# Patient Record
Sex: Female | Born: 1980
Health system: Southern US, Community
[De-identification: ages and names within clinical notes are randomized; demographics above are authoritative.]

## PROBLEM LIST (undated history)

## (undated) DIAGNOSIS — A599 Trichomoniasis, unspecified: Secondary | ICD-10-CM

## (undated) DIAGNOSIS — E010 Iodine-deficiency related diffuse (endemic) goiter: Secondary | ICD-10-CM

## (undated) HISTORY — DX: Iodine-deficiency related diffuse (endemic) goiter: E01.0

## (undated) HISTORY — DX: Trichomoniasis, unspecified: A59.9

---

## 2004-03-27 ENCOUNTER — Emergency Department (HOSPITAL_COMMUNITY): Admission: EM | Admit: 2004-03-27 | Discharge: 2004-03-28 | Payer: Self-pay | Admitting: Emergency Medicine

## 2011-10-19 ENCOUNTER — Ambulatory Visit (INDEPENDENT_AMBULATORY_CARE_PROVIDER_SITE_OTHER): Payer: PRIVATE HEALTH INSURANCE | Admitting: Family Medicine

## 2011-10-19 ENCOUNTER — Encounter: Payer: Self-pay | Admitting: Family Medicine

## 2011-10-19 VITALS — BP 136/79 | HR 57 | Temp 98.9°F | Resp 16 | Ht 62.5 in | Wt 143.6 lb

## 2011-10-19 DIAGNOSIS — Z0000 Encounter for general adult medical examination without abnormal findings: Secondary | ICD-10-CM

## 2011-10-19 DIAGNOSIS — E049 Nontoxic goiter, unspecified: Secondary | ICD-10-CM

## 2011-10-19 DIAGNOSIS — Z130 Encounter for screening for diseases of the blood and blood-forming organs and certain disorders involving the immune mechanism: Secondary | ICD-10-CM

## 2011-10-19 DIAGNOSIS — Z13220 Encounter for screening for lipoid disorders: Secondary | ICD-10-CM

## 2011-10-19 DIAGNOSIS — Z720 Tobacco use: Secondary | ICD-10-CM | POA: Insufficient documentation

## 2011-10-19 DIAGNOSIS — Z23 Encounter for immunization: Secondary | ICD-10-CM

## 2011-10-19 DIAGNOSIS — Z3042 Encounter for surveillance of injectable contraceptive: Secondary | ICD-10-CM | POA: Insufficient documentation

## 2011-10-19 DIAGNOSIS — Z01419 Encounter for gynecological examination (general) (routine) without abnormal findings: Secondary | ICD-10-CM

## 2011-10-19 DIAGNOSIS — Z309 Encounter for contraceptive management, unspecified: Secondary | ICD-10-CM | POA: Insufficient documentation

## 2011-10-19 DIAGNOSIS — Z124 Encounter for screening for malignant neoplasm of cervix: Secondary | ICD-10-CM

## 2011-10-19 LAB — COMPREHENSIVE METABOLIC PANEL
ALT: 12 U/L (ref 0–35)
AST: 20 U/L (ref 0–37)
Alkaline Phosphatase: 58 U/L (ref 39–117)
CO2: 25 mEq/L (ref 19–32)
Chloride: 106 mEq/L (ref 96–112)

## 2011-10-19 LAB — CBC WITH DIFFERENTIAL/PLATELET
Eosinophils Relative: 1 % (ref 0–5)
HCT: 41.3 % (ref 36.0–46.0)
Lymphocytes Relative: 43 % (ref 12–46)
Lymphs Abs: 3.4 10*3/uL (ref 0.7–4.0)
MCV: 94.5 fL (ref 78.0–100.0)
Monocytes Absolute: 0.5 10*3/uL (ref 0.1–1.0)
Monocytes Relative: 6 % (ref 3–12)
RBC: 4.37 MIL/uL (ref 3.87–5.11)
WBC: 8 10*3/uL (ref 4.0–10.5)

## 2011-10-19 LAB — LIPID PANEL
Cholesterol: 192 mg/dL (ref 0–200)
HDL: 46 mg/dL (ref >39)
Total CHOL/HDL Ratio: 4.2 Ratio

## 2011-10-19 LAB — POCT WET PREP WITH KOH
KOH Prep POC: NEGATIVE
Trichomonas, UA: NEGATIVE

## 2011-10-19 LAB — TSH: TSH: 0.435 u[IU]/mL (ref 0.350–4.500)

## 2011-10-19 MED ORDER — MEDROXYPROGESTERONE ACETATE 150 MG/ML IM SUSP: 150.0000 mg | INTRAMUSCULAR | Status: DC

## 2011-10-19 NOTE — Progress Notes (Signed)
Subjective:    Patient ID: Lacey Wu, female    DOB: 03/08/1981, 31 y.o.   MRN: [id number]  HPI   This 31 y.o. AA female is new to this facility and requests CPE with PAP. Last PAP was ~ 1 year ago  and was normal. She is single, nulliparous and smokes 1/2 ppd for 11 years. She does not consume  alcohol nor exercise regularly. She works full-time at [employer].           DepoProvera is her preferred method of contraception; she last received an injection 2 years ago.    Review of Systems  Constitutional: Negative.   Eyes: Positive for visual disturbance.       Has corrective lenses; no recent exam  Respiratory: Negative.   Cardiovascular: Negative.   Gastrointestinal: Negative.   Genitourinary: Negative.   Musculoskeletal: Negative.   Neurological: Negative.   Hematological: Negative.   Psychiatric/Behavioral: Negative.        Objective:   Physical Exam  Nursing note and vitals reviewed. Constitutional: She is oriented to person, place, and time. She appears well-developed and well-nourished. No distress.  HENT:  Head: Normocephalic and atraumatic.  Right Ear: External ear normal.  Left Ear: External ear normal.  Nose: Nose normal.  Mouth/Throat: Oropharynx is clear and moist.  Eyes: Conjunctivae and EOM are normal. Pupils are equal, round, and reactive to light. No scleral icterus.  Neck: Normal range of motion. Neck supple. Thyromegaly present.  Cardiovascular: Normal rate, regular rhythm and normal heart sounds.  Exam reveals no gallop and no friction rub.   No murmur heard. Pulmonary/Chest: Effort normal and breath sounds normal. No respiratory distress. She has no wheezes. Right breast exhibits no inverted nipple, no mass, no nipple discharge, no skin change and no tenderness. Left breast exhibits no inverted nipple, no mass, no nipple discharge, no skin change and no tenderness. Breasts are symmetrical.  Abdominal: Soft. Bowel sounds are normal. She  exhibits no mass. There is no tenderness. There is no guarding.  Genitourinary: Uterus normal. There is no rash, tenderness or lesion on the right labia. There is no rash, tenderness or lesion on the left labia. Cervix exhibits discharge. Cervix exhibits no motion tenderness and no friability. Right adnexum displays no mass, no tenderness and no fullness. Left adnexum displays no mass, no tenderness and no fullness. No erythema, tenderness or bleeding around the vagina. Vaginal discharge found.  Musculoskeletal: Normal range of motion.  Lymphadenopathy:    She has no cervical adenopathy.       Right: No inguinal adenopathy present.       Left: No inguinal adenopathy present.  Neurological: She is alert and oriented to person, place, and time. She displays abnormal reflex. No cranial nerve deficit. She exhibits normal muscle tone. Coordination normal.       DTRs absent in upper ext; 0-1+ in lower ext  Skin: Skin is warm and dry. No rash noted. No erythema.  Psychiatric: She has a normal mood and affect. Her behavior is normal. Judgment and thought content normal.    Results for orders placed in visit on 10/19/11  POCT WET PREP WITH KOH      Component Value Range   Trichomonas, UA Negative     Clue Cells Wet Prep HPF POC 4-8     Epithelial Wet Prep HPF POC 2-20     Yeast Wet Prep HPF POC neg     Bacteria Wet Prep HPF POC 1+  RBC Wet Prep HPF POC neg     WBC Wet Prep HPF POC 0-5     KOH Prep POC Negative          Assessment & Plan:   1. Routine general medical examination at a health care facility  Comprehensive metabolic panel  2. Encounter for cervical Pap smear with pelvic exam  Pap IG, CT/NG w/ reflex HPV when ASC-U  3. Enlarged thyroid gland  TSH, Free T4  4. Need for prophylactic vaccination with combined diphtheria-tetanus-pertussis (DTP) vaccine  Tdap vaccine greater than or equal to 7yo IM  5. Screening for deficiency anemia  CBC with Differential  6. Screening for  hyperlipidemia  Lipid panel  7. Contraception management  RX: DepoProvera for injection; pt to call clinic to set up 1st injection on the 5th day of next menses  8. Tobacco user  Counsel to try Nicotine gum again and pick a QUIT date

## 2011-10-19 NOTE — Patient Instructions (Addendum)
Keeping You Healthy  Get These Tests 1. Blood Pressure- Have your blood pressure checked once a year by your health care provider.  Normal blood pressure is 120/80. 2. Weight- Have your body mass index (BMI) calculated to screen for obesity.  BMI is measure of body fat based on height and weight.  You can also calculate your own BMI at [email or web address]://www.west-[email or web address]/. 3. Cholesterol- Have your cholesterol checked every 5 years starting at age 29 then yearly starting at age 29. 4. Chlamydia, HIV, and other sexually transmitted diseases- Get screened every year until age 68, then within three months of each new sexual provider. 5. Pap Smear- Every 1-3 years; discuss with your health care provider. 6. Mammogram- Every year starting at age 81  Take these medicines  Calcium with Vitamin D-Your body needs 1200 mg of Calcium each day and [phone] IU of Vitamin D daily.  Your body can only absorb 500 mg of Calcium at a time so Calcium must be taken in 2 or 3 divided doses throughout the day.  Multivitamin with folic acid- Once daily if it is possible for you to become pregnant.  Get these Immunizations  Gardasil-Series of three doses; prevents HPV related illness such as genital warts and cervical cancer.  Menactra-Single dose; prevents meningitis.  Tetanus shot- Every 10 years.  Flu shot-Every year.  Take these steps 1. Do not smoke-Your healthcare provider can help you quit.  For tips on how to quit go to www.smokefree.gov or call 1-800 QUITNOW. 2. Be physically active- Exercise 5 days a week for at least 30 minutes.  If you are not already physically active, start slow and gradually work up to 30 minutes of moderate physical activity.  Examples of moderate activity include walking briskly, dancing, swimming, bicycling, etc. 3. Breast Cancer- A self breast exam every month is important for early detection of breast cancer.  For more information and instruction on self breast exams, ask your  healthcare provider or [email or web address]. 4. Eat a healthy diet- Eat a variety of healthy foods such as fruits, vegetables, whole grains, low fat milk, low fat cheeses, yogurt, lean meats, poultry and fish, beans, nuts, tofu, etc.  For more information go to www. Thenutritionsource.org 5. Drink alcohol in moderation- Limit alcohol intake to one drink or less per day. Never drink and drive. 6. Depression- Your emotional health is as important as your physical health.  If you're feeling down or losing interest in things you normally enjoy please talk to your healthcare provider about being screened for depression. 7. Dental visit- Brush and floss your teeth twice daily; visit your dentist twice a year. 8. Eye doctor- Get an eye exam at least every 2 years. 9. Helmet use- Always wear a helmet when riding a bicycle, motorcycle, rollerblading or skateboarding. 10. Safe sex- If you may be exposed to sexually transmitted infections, use a condom. 11. Seat belts- Seat belts can save your live; always wear one. 12. Smoke/Carbon Monoxide detectors- These detectors need to be installed on the appropriate level of your home. Replace batteries at least once a year. 13. Skin cancer- When out in the sun please cover up and use sunscreen 15 SPF or higher. 14. Violence- If anyone is threatening or hurting you, please tell your healthcare provider.       Smoking Cessation This document explains the best ways for you to quit smoking and new treatments to help. It lists new medicines that can double or triple your chances of quitting and quitting for  good. It also considers ways to avoid relapses and concerns you may have about quitting, including weight gain. NICOTINE: A POWERFUL ADDICTION If you have tried to quit smoking, you know how hard it can be. It is hard because nicotine is a very addictive drug. For some people, it can be as addictive as heroin or cocaine. Usually, people make  2 or 3 tries, or more, before finally being able to quit. Each time you try to quit, you can learn about what helps and what hurts. Quitting takes hard work and a lot of effort, but you can quit smoking. QUITTING SMOKING IS ONE OF THE MOST IMPORTANT THINGS YOU WILL EVER DO.  You will live longer, feel better, and live better.   The impact on your body of quitting smoking is felt almost immediately:   Within 20 minutes, blood pressure decreases. Pulse returns to its normal level.   After 8 hours, carbon monoxide levels in the blood return to normal. Oxygen level increases.   After 24 hours, chance of heart attack starts to decrease. Breath, hair, and body stop smelling like smoke.   After 48 hours, damaged nerve endings begin to recover. Sense of taste and smell improve.   After 72 hours, the body is virtually free of nicotine. Bronchial tubes relax and breathing becomes easier.   After 2 to 12 weeks, lungs can hold more air. Exercise becomes easier and circulation improves.   Quitting will reduce your risk of having a heart attack, stroke, cancer, or lung disease:   After 1 year, the risk of coronary heart disease is cut in half.   After 5 years, the risk of stroke falls to the same as a nonsmoker.   After 10 years, the risk of lung cancer is cut in half and the risk of other cancers decreases significantly.   After 15 years, the risk of coronary heart disease drops, usually to the level of a nonsmoker.   If you are pregnant, quitting smoking will improve your chances of having a healthy baby.   The people you live with, especially your children, will be healthier.   You will have extra money to spend on things other than cigarettes.  FIVE KEYS TO QUITTING Studies have shown that these 5 steps will help you quit smoking and quit for good. You have the best chances of quitting if you use them together: 1. Get ready.  2. Get support and encouragement.  3. Learn new skills and  behaviors.  4. Get medicine to reduce your nicotine addiction and use it correctly.  5. Be prepared for relapse or difficult situations. Be determined to continue trying to quit, even if you do not succeed at first.  1. GET READY  Set a quit date.   Change your environment.   Get rid of ALL cigarettes, ashtrays, matches, and lighters in your home, car, and place of work.   Do not let people smoke in your home.   Review your past attempts to quit. Think about what worked and what did not.   Once you quit, do not smoke. NOT EVEN A PUFF!  2. GET SUPPORT AND ENCOURAGEMENT Studies have shown that you have a better chance of being successful if you have help. You can get support in many ways.  Tell your family, friends, and coworkers that you are going to quit and need their support. Ask them not to smoke around you.   Talk to your caregivers (doctor, dentist, nurse, pharmacist, psychologist,  and/or smoking counselor).   Get individual, group, or telephone counseling and support. The more counseling you have, the better your chances are of quitting. Programs are available at [employer] and health centers. Call your local health department for information about programs in your area.   Spiritual beliefs and practices may help some smokers quit.   Quit meters are Photographer that keep track of quit statistics, such as amount of "quit-time," cigarettes not smoked, and money saved.   Many smokers find one or more of the many self-help books available useful in helping them quit and stay off tobacco.  3. LEARN NEW SKILLS AND BEHAVIORS  Try to distract yourself from urges to smoke. Talk to someone, go for a walk, or occupy your time with a task.   When you first try to quit, change your routine. Take a different route to work. Drink tea instead of coffee. Eat breakfast in a different place.   Do something to reduce your stress. Take a hot bath,  exercise, or read a book.   Plan something enjoyable to do every day. Reward yourself for not smoking.   Explore interactive web-based programs that specialize in helping you quit.  4. GET MEDICINE AND USE IT CORRECTLY Medicines can help you stop smoking and decrease the urge to smoke. Combining medicine with the above behavioral methods and support can quadruple your chances of successfully quitting smoking. The U.[place]. Food and Drug Administration (FDA) has approved 7 medicines to help you quit smoking. These medicines fall into 3 categories.  Nicotine replacement therapy (delivers nicotine to your body without the negative effects and risks of smoking):   Nicotine gum: Available over-the-counter.   Nicotine lozenges: Available over-the-counter.   Nicotine inhaler: Available by prescription.   Nicotine nasal spray: Available by prescription.   Nicotine skin patches (transdermal): Available by prescription and over-the-counter.   Antidepressant medicine (helps people abstain from smoking, but how this works is unknown):   Bupropion sustained-release (SR) tablets: Available by prescription.   Nicotinic receptor partial agonist (simulates the effect of nicotine in your brain):   Varenicline tartrate tablets: Available by prescription.   Ask your caregiver for advice about which medicines to use and how to use them. Carefully read the information on the package.   Everyone who is trying to quit may benefit from using a medicine. If you are pregnant or trying to become pregnant, nursing an infant, you are under age 74, or you smoke fewer than 10 cigarettes per day, talk to your caregiver before taking any nicotine replacement medicines.   You should stop using a nicotine replacement product and call your caregiver if you experience nausea, dizziness, weakness, vomiting, fast or irregular heartbeat, mouth problems with the lozenge or gum, or redness or swelling of the skin around the patch  that does not go away.   Do not use any other product containing nicotine while using a nicotine replacement product.   Talk to your caregiver before using these products if you have diabetes, heart disease, asthma, stomach ulcers, you had a recent heart attack, you have high blood pressure that is not controlled with medicine, a history of irregular heartbeat, or you have been prescribed medicine to help you quit smoking.  5. BE PREPARED FOR RELAPSE OR DIFFICULT SITUATIONS  Most relapses occur within the first 3 months after quitting. Do not be discouraged if you start smoking again. Remember, most people try several times before they finally quit.  You may have symptoms of withdrawal because your body is used to nicotine. You may crave cigarettes, be irritable, feel very hungry, cough often, get headaches, or have difficulty concentrating.   The withdrawal symptoms are only temporary. They are strongest when you first quit, but they will go away within 10 to 14 days.  Here are some difficult situations to watch for:  Alcohol. Avoid drinking alcohol. Drinking lowers your chances of successfully quitting.   Caffeine. Try to reduce the amount of caffeine you consume. It also lowers your chances of successfully quitting.   Other smokers. Being around smoking can make you want to smoke. Avoid smokers.   Weight gain. Many smokers will gain weight when they quit, usually less than 10 pounds. Eat a healthy diet and stay active. Do not let weight gain distract you from your main goal, quitting smoking. Some medicines that help you quit smoking may also help delay weight gain. You can always lose the weight gained after you quit.   Bad mood or depression. There are a lot of ways to improve your mood other than smoking.  If you are having problems with any of these situations, talk to your caregiver. SPECIAL SITUATIONS AND CONDITIONS Studies suggest that everyone can quit smoking. Your situation or  condition can give you a special reason to quit.  Pregnant women/new mothers: By quitting, you protect your baby's health and your own.   Hospitalized patients: By quitting, you reduce health problems and help healing.   Heart attack patients: By quitting, you reduce your risk of a second heart attack.   Lung, head, and neck cancer patients: By quitting, you reduce your chance of a second cancer.   Parents of children and adolescents: By quitting, you protect your children from illnesses caused by secondhand smoke.  QUESTIONS TO THINK ABOUT Think about the following questions before you try to stop smoking. You may want to talk about your answers with your caregiver.  Why do you want to quit?   If you tried to quit in the past, what helped and what did not?   What will be the most difficult situations for you after you quit? How will you plan to handle them?   Who can help you through the tough times? Your family? Friends? Caregiver?   What pleasures do you get from smoking? What ways can you still get pleasure if you quit?  Here are some questions to ask your caregiver:  How can you help me to be successful at quitting?   What medicine do you think would be best for me and how should I take it?   What should I do if I need more help?   What is smoking withdrawal like? How can I get information on withdrawal?  Quitting takes hard work and a lot of effort, but you can quit smoking. FOR MORE INFORMATION  Smokefree.gov (http://www.davis-sullivan.com/) provides free, accurate, evidence-based information and professional assistance to help support the immediate and long-term needs of people trying to quit smoking. Document Released: 05/18/2001 Document Revised: 05/13/2011 Document Reviewed: 03/10/2009 [facility] Patient Information 2012 [place], [place].

## 2011-10-22 ENCOUNTER — Other Ambulatory Visit: Payer: Self-pay | Admitting: Family Medicine

## 2011-10-22 LAB — PAP IG, CT-NG, RFX HPV ASCU
Chlamydia Probe Amp: NEGATIVE
GC Probe Amp: NEGATIVE

## 2011-10-22 NOTE — Progress Notes (Signed)
Quick Note:  Please call pt and advise that the following labs are abnormal... All labs are normal except LDL("bad") cholesterol. Try to improve nutrition- eliminate junk food and processed foods and fried foods as much as possible. Consume more fruits and vegetables and maintain an active lifestyle.   Copy to pt. ______

## 2011-10-22 NOTE — Progress Notes (Signed)
Quick Note:  PAP is Negative (normal) and tests for chlamydia and GC are negative. Next PAP in 1-2 years.  The PAP results say Trichonomas is present.. Our lab technician did not see evidence of this infection but there was a significant discharge. I will need to prescribe medication for this infection once the pt provides name of pharmacy.   Pt 's partner needs to seek treatment with primary care provider or Health Dept or at [facility]..  Copy to pt. ______

## 2011-10-24 ENCOUNTER — Encounter: Payer: Self-pay | Admitting: *Deleted

## 2011-11-04 ENCOUNTER — Ambulatory Visit (INDEPENDENT_AMBULATORY_CARE_PROVIDER_SITE_OTHER): Payer: PRIVATE HEALTH INSURANCE | Admitting: Family Medicine

## 2011-11-04 ENCOUNTER — Encounter: Payer: Self-pay | Admitting: Family Medicine

## 2011-11-04 VITALS — BP 124/78 | HR 65 | Temp 98.6°F | Resp 16 | Ht 62.5 in | Wt 145.6 lb

## 2011-11-04 DIAGNOSIS — Z3042 Encounter for surveillance of injectable contraceptive: Secondary | ICD-10-CM

## 2011-11-04 DIAGNOSIS — Z720 Tobacco use: Secondary | ICD-10-CM

## 2011-11-04 DIAGNOSIS — Z3049 Encounter for surveillance of other contraceptives: Secondary | ICD-10-CM

## 2011-11-04 DIAGNOSIS — F17200 Nicotine dependence, unspecified, uncomplicated: Secondary | ICD-10-CM

## 2011-11-04 MED ORDER — MEDROXYPROGESTERONE ACETATE 150 MG/ML IM SUSP
150.0000 mg | INTRAMUSCULAR | Status: AC
  Administered 2012-01-21: 150 mg via INTRAMUSCULAR

## 2011-11-04 MED ORDER — MEDROXYPROGESTERONE ACETATE 150 MG/ML IM SUSP
150.0000 mg | Freq: Once | INTRAMUSCULAR | Status: AC
  Administered 2011-11-04: 150 mg via INTRAMUSCULAR

## 2011-11-04 NOTE — Progress Notes (Signed)
  Subjective:    Patient ID: Lacey Wu, female    DOB: 05/19/1981, 31 y.o.   MRN: [id number]  HPI  This 31 y.o. AA female is here for resumption of DepoProvera injection; previous use resulted in   amenorrhea (this is acceptable to pt). She is also aware of potential weight gain and has made changes   to diet (needs to lower LDL chol).Jb involves constant activity. She plans to stop smoking December 05, 2011.    Review of Systems Noncontributory     Objective:   Physical Exam  Vitals reviewed. Constitutional: She is oriented to person, place, and time. She appears well-developed and well-nourished. No distress.  HENT:  Head: Normocephalic and atraumatic.  Eyes: Conjunctivae and EOM are normal. No scleral icterus.  Cardiovascular: Normal rate.   Pulmonary/Chest: Effort normal. No respiratory distress.  Neurological: She is alert and oriented to person, place, and time. No cranial nerve deficit.  Skin: Skin is warm and dry.          Assessment & Plan:   1. Encounter for Depo-Provera contraception  medroxyPROGESTERone (DEPO-PROVERA) injection 150 mg, medroxyPROGESTERone (DEPO-PROVERA) injection 150 mg administered today and pt to RTC in 3 months for next injection

## 2011-11-04 NOTE — Patient Instructions (Signed)
RTC on 01/20/12 for next DepoProvera injection. Please do stop smoking on December 05, 2011.

## 2012-01-21 ENCOUNTER — Ambulatory Visit (INDEPENDENT_AMBULATORY_CARE_PROVIDER_SITE_OTHER): Payer: PRIVATE HEALTH INSURANCE | Admitting: Physician Assistant

## 2012-01-21 VITALS — BP 116/66 | HR 66 | Temp 98.4°F | Resp 16 | Ht 62.5 in | Wt 142.2 lb

## 2012-01-21 DIAGNOSIS — Z3049 Encounter for surveillance of other contraceptives: Secondary | ICD-10-CM

## 2012-01-21 DIAGNOSIS — Z3042 Encounter for surveillance of injectable contraceptive: Secondary | ICD-10-CM

## 2012-01-21 NOTE — Progress Notes (Signed)
  Subjective:    Patient ID: Lacey Wu, female    DOB: December 28, 1980, 31 y.o.   MRN: [id number]  HPI  Here for her Depo provera.  She is having no problems.  Review of Systems     Objective:   Physical Exam        Assessment & Plan:  Give Depo today.  RTC in 3 months for repeat injection.

## 2012-02-12 ENCOUNTER — Ambulatory Visit: Payer: PRIVATE HEALTH INSURANCE

## 2012-02-12 ENCOUNTER — Ambulatory Visit (INDEPENDENT_AMBULATORY_CARE_PROVIDER_SITE_OTHER): Payer: PRIVATE HEALTH INSURANCE | Admitting: Emergency Medicine

## 2012-02-12 VITALS — BP 128/78 | HR 64 | Temp 99.0°F | Resp 16 | Ht 63.25 in | Wt 140.2 lb

## 2012-02-12 DIAGNOSIS — R197 Diarrhea, unspecified: Secondary | ICD-10-CM

## 2012-02-12 DIAGNOSIS — A09 Infectious gastroenteritis and colitis, unspecified: Secondary | ICD-10-CM

## 2012-02-12 DIAGNOSIS — R109 Unspecified abdominal pain: Secondary | ICD-10-CM

## 2012-02-12 DIAGNOSIS — R1084 Generalized abdominal pain: Secondary | ICD-10-CM

## 2012-02-12 LAB — POCT CBC
HCT, POC: 49 % — AB (ref 37.7–47.9)
Hemoglobin: 15.1 g/dL (ref 12.2–16.2)
Lymph, poc: 3.1 (ref 0.6–3.4)
MCH, POC: 30.5 pg (ref 27–31.2)
MCHC: 30.8 g/dL — AB (ref 31.8–35.4)
WBC: 7.9 10*3/uL (ref 4.6–10.2)

## 2012-02-12 MED ORDER — DICYCLOMINE HCL 20 MG PO TABS: 20.0000 mg | ORAL_TABLET | Freq: Four times a day (QID) | ORAL | Status: DC

## 2012-02-12 NOTE — Patient Instructions (Signed)
Diarrhea Infections caused by germs (bacterial) or a virus commonly cause diarrhea. Your caregiver has determined that with time, rest and fluids, the diarrhea should improve. In general, eat normally while drinking more water than usual. Although water may prevent dehydration, it does not contain salt and minerals (electrolytes). Broths, weak tea without caffeine and oral rehydration solutions (ORS) replace fluids and electrolytes. Small amounts of fluids should be taken frequently. Large amounts at one time may not be tolerated. Plain water may be harmful in infants and the elderly. Oral rehydrating solutions (ORS) are available at pharmacies and grocery stores. ORS replace water and important electrolytes in proper proportions. Sports drinks are not as effective as ORS and may be harmful due to sugars worsening diarrhea.  ORS is especially recommended for use in children with diarrhea. As a general guideline for children, replace any new fluid losses from diarrhea and/or vomiting with ORS as follows:   If your child weighs 22 pounds or under (10 kg or less), give 60-120 mL ( -  cup or 2 - 4 ounces) of ORS for each episode of diarrheal stool or vomiting episode.   If your child weighs more than 22 pounds (more than 10 kgs), give 120-240 mL ( - 1 cup or 4 - 8 ounces) of ORS for each diarrheal stool or episode of vomiting.   While correcting for dehydration, children should eat normally. However, foods high in sugar should be avoided because this may worsen diarrhea. Large amounts of carbonated soft drinks, juice, gelatin desserts and other highly sugared drinks should be avoided.   After correction of dehydration, other liquids that are appealing to the child may be added. Children should drink small amounts of fluids frequently and fluids should be increased as tolerated. Children should drink enough fluids to keep urine clear or pale yellow.   Adults should eat normally while drinking more fluids  than usual. Drink small amounts of fluids frequently and increase as tolerated. Drink enough fluids to keep urine clear or pale yellow. Broths, weak decaffeinated tea, lemon lime soft drinks (allowed to go flat) and ORS replace fluids and electrolytes.   Avoid:   Carbonated drinks.   Juice.   Extremely hot or cold fluids.   Caffeine drinks.   Fatty, greasy foods.   Alcohol.   Tobacco.   Too much intake of anything at one time.   Gelatin desserts.   Probiotics are active cultures of beneficial bacteria. They may lessen the amount and number of diarrheal stools in adults. Probiotics can be found in yogurt with active cultures and in supplements.   Wash hands well to avoid spreading bacteria and virus.   Anti-diarrheal medications are not recommended for infants and children.   Only take over-the-counter or prescription medicines for pain, discomfort or fever as directed by your caregiver. Do not give aspirin to children because it may cause Reye's Syndrome.   For adults, ask your caregiver if you should continue all prescribed and over-the-counter medicines.   If your caregiver has given you a follow-up appointment, it is very important to keep that appointment. Not keeping the appointment could result in a chronic or permanent injury, and disability. If there is any problem keeping the appointment, you must call back to this facility for assistance.  SEEK IMMEDIATE MEDICAL CARE IF:   You or your child is unable to keep fluids down or other symptoms or problems become worse in spite of treatment.   Vomiting or diarrhea develops and becomes persistent.     There is vomiting of blood or bile (green material).   There is blood in the stool or the stools are black and tarry.   There is no urine output in 6-8 hours or there is only a small amount of very dark urine.   Abdominal pain develops, increases or localizes.   You have a fever.   Your baby is older than 3 months with a  rectal temperature of 102 F (38.9 C) or higher.   Your baby is 3 months old or younger with a rectal temperature of 100.4 F (38 C) or higher.   You or your child develops excessive weakness, dizziness, fainting or extreme thirst.   You or your child develops a rash, stiff neck, severe headache or become irritable or sleepy and difficult to awaken.  MAKE SURE YOU:   Understand these instructions.   Will watch your condition.   Will get help right away if you are not doing well or get worse.  Document Released: 05/14/2002 Document Revised: 05/13/2011 Document Reviewed: 03/31/2009 Exit[employer] Patient Information 2012 [employer]. 

## 2012-02-12 NOTE — Progress Notes (Signed)
  Subjective:    Patient ID: Lacey Wu, female    DOB: 1981/02/05, 31 y.o.   MRN: [id number]  HPI A pleasant 31 year old female presents with diarrhea x 6 days. diarrhea 5-6 times a day. no blood in stool, questions mucous in stool, very "runny"; no recent travels, no recent antibiotics; not under a lot of stress; no one else sick at home. No vomiting. no fever. no stomach pain except cramping with diarrhea.  Works at [employer]   Review of Systems     Objective:   Physical Exam HEENT exam is unremarkable. Patient is not icteric. The neck is supple. Chest is clear to auscultation and percussion. Heart regular rate no murmurs. Abdomen is flat liver and spleen are not enlarged there is no tenderness or masses palpable. Results for orders placed in visit on 02/12/12  POCT CBC      Component Value Range   WBC 7.9  4.6 - 10.2 K/uL   Lymph, poc 3.1  0.6 - 3.4   POC LYMPH PERCENT 39.6  10 - 50 %L   MID (cbc) 0.7  0 - 0.9   POC MID % 9.0  0 - 12 %M   POC Granulocyte 4.1  2 - 6.9   Granulocyte percent 51.4  37 - 80 %G   RBC 4.95  4.04 - 5.48 M/uL   Hemoglobin 15.1  12.2 - 16.2 g/dL   HCT, POC 78.2 (*) 95.6 - 47.9 %   MCV 98.9 (*) 80 - 97 fL   MCH, POC 30.5  27 - 31.2 pg   MCHC 30.8 (*) 31.8 - 35.4 g/dL   RDW, POC 21.3     Platelet Count, POC 338  142 - 424 K/uL   MPV 8.4  0 - 99.8 fL  IFOBT (OCCULT BLOOD)      Component Value Range   IFOBT Negative     [facility] reading (PRIMARY) by  Dr. Cleta Alberts they're isolated air-fluid levels in the right lower abd. however there is air filling the entire colon. No evidence of obstruction. I did not see any free air       Assessment & Plan:  Advise patient to take Imodium right ear up to 3 times a day. Have also written a prescription for Bentyl to be treat as irritable bowel. If she does not subside her symptoms in the next 2-3 days she is to let him know and we'll make a GI referral.

## 2012-02-15 ENCOUNTER — Telehealth: Payer: Self-pay | Admitting: Radiology

## 2012-02-15 NOTE — Telephone Encounter (Signed)
Patient came in work note addendum is made for her and she is advised of negative labs.

## 2012-04-07 ENCOUNTER — Telehealth: Payer: Self-pay | Admitting: *Deleted

## 2012-04-07 ENCOUNTER — Ambulatory Visit (INDEPENDENT_AMBULATORY_CARE_PROVIDER_SITE_OTHER): Payer: PRIVATE HEALTH INSURANCE | Admitting: Physician Assistant

## 2012-04-07 DIAGNOSIS — Z309 Encounter for contraceptive management, unspecified: Secondary | ICD-10-CM

## 2012-04-07 DIAGNOSIS — IMO0001 Reserved for inherently not codable concepts without codable children: Secondary | ICD-10-CM

## 2012-04-07 MED ORDER — MEDROXYPROGESTERONE ACETATE 150 MG/ML IM SUSP
150.0000 mg | Freq: Once | INTRAMUSCULAR | Status: AC
  Administered 2012-04-07: 150 mg via INTRAMUSCULAR

## 2012-04-07 NOTE — Progress Notes (Signed)
   Patient ID: Refugio Brekken MRN: [id number], DOB: [birth year], 31 y.o. Date of Encounter: 04/07/2012, 8:24 AM  Primary Physician: No primary provider on file.  Chief Complaint: Here for Depo-Provera injection  31 y.o. year old female here for Depo-Provera injection. On time. Last injection was 01/21/12. Ok to give Depo-Provera injection. Next injection due on 06/23/12-07/07/12. This was a nursing only encounter. No provider/patient encounter occurred today.   Signed, Eula Listen, PA-C 04/07/2012 8:24 AM

## 2012-06-23 ENCOUNTER — Ambulatory Visit (INDEPENDENT_AMBULATORY_CARE_PROVIDER_SITE_OTHER): Payer: PRIVATE HEALTH INSURANCE | Admitting: Physician Assistant

## 2012-06-23 VITALS — BP 148/78 | HR 81 | Temp 99.4°F | Resp 16 | Ht 64.0 in | Wt 149.0 lb

## 2012-06-23 DIAGNOSIS — Z309 Encounter for contraceptive management, unspecified: Secondary | ICD-10-CM

## 2012-06-23 DIAGNOSIS — IMO0001 Reserved for inherently not codable concepts without codable children: Secondary | ICD-10-CM

## 2012-06-23 MED ORDER — MEDROXYPROGESTERONE ACETATE 150 MG/ML IM SUSP
150.0000 mg | Freq: Once | INTRAMUSCULAR | Status: AC
  Administered 2012-06-23: 150 mg via INTRAMUSCULAR

## 2012-06-23 NOTE — Progress Notes (Signed)
  Subjective:    Patient ID: Lacey Wu, female    DOB: 1981-01-17, 32 y.o.   MRN: [id number]  HPI here for Depo Provera last 04/07/12. Within window 1/17-1/31. Next due is 4/4-4/18. Is able to get 2 more injections before needing pap.    Review of Systems     Objective:   Physical Exam        Assessment & Plan:

## 2012-09-08 ENCOUNTER — Ambulatory Visit (INDEPENDENT_AMBULATORY_CARE_PROVIDER_SITE_OTHER): Payer: PRIVATE HEALTH INSURANCE | Admitting: Physician Assistant

## 2012-09-08 VITALS — BP 112/72 | HR 60 | Temp 98.9°F | Resp 16 | Ht 63.0 in | Wt 153.0 lb

## 2012-09-08 DIAGNOSIS — IMO0001 Reserved for inherently not codable concepts without codable children: Secondary | ICD-10-CM

## 2012-09-08 DIAGNOSIS — Z309 Encounter for contraceptive management, unspecified: Secondary | ICD-10-CM

## 2012-09-08 MED ORDER — MEDROXYPROGESTERONE ACETATE 150 MG/ML IM SUSP
150.0000 mg | Freq: Once | INTRAMUSCULAR | Status: AC
  Administered 2012-09-08: 150 mg via INTRAMUSCULAR

## 2012-09-08 NOTE — Progress Notes (Signed)
  Subjective:    Patient ID: Lacey Wu, female    DOB: February 04, 1981, 32 y.o.   MRN: [id number]  HPI Within window for depo provera. She will be ok to get one more after this visit before pap.   Review of Systems not done     Objective:   Physical Exam not seen by a provider        Assessment & Plan:  Ok for depo provera today and ok for next one in 12 weeks

## 2012-12-04 ENCOUNTER — Ambulatory Visit (INDEPENDENT_AMBULATORY_CARE_PROVIDER_SITE_OTHER): Payer: PRIVATE HEALTH INSURANCE | Admitting: *Deleted

## 2012-12-04 VITALS — BP 98/54 | HR 71 | Temp 98.1°F | Resp 16 | Ht 62.5 in | Wt 156.4 lb

## 2012-12-04 DIAGNOSIS — Z309 Encounter for contraceptive management, unspecified: Secondary | ICD-10-CM

## 2012-12-04 DIAGNOSIS — IMO0001 Reserved for inherently not codable concepts without codable children: Secondary | ICD-10-CM

## 2012-12-04 MED ORDER — MEDROXYPROGESTERONE ACETATE 150 MG/ML IM SUSP
150.0000 mg | Freq: Once | INTRAMUSCULAR | Status: AC
  Administered 2012-12-04: 150 mg via INTRAMUSCULAR

## 2012-12-04 NOTE — Progress Notes (Signed)
  Subjective:    Patient ID: Lacey Wu, female    DOB: 01-18-1981, 32 y.o.   MRN: [id number]  HPI    Review of Systems     Objective:   Physical Exam        Assessment & Plan:  Patient here for Depo-Provera 150 mg injection only. Patient is on time for injection.

## 2013-02-26 ENCOUNTER — Ambulatory Visit (INDEPENDENT_AMBULATORY_CARE_PROVIDER_SITE_OTHER): Payer: PRIVATE HEALTH INSURANCE | Admitting: Physician Assistant

## 2013-02-26 ENCOUNTER — Encounter: Payer: Self-pay | Admitting: *Deleted

## 2013-02-26 VITALS — BP 118/77 | Temp 98.6°F

## 2013-02-26 DIAGNOSIS — IMO0001 Reserved for inherently not codable concepts without codable children: Secondary | ICD-10-CM

## 2013-02-26 DIAGNOSIS — Z309 Encounter for contraceptive management, unspecified: Secondary | ICD-10-CM

## 2013-02-26 MED ORDER — MEDROXYPROGESTERONE ACETATE 150 MG/ML IM SUSP
150.0000 mg | Freq: Once | INTRAMUSCULAR | Status: AC
  Administered 2013-02-26: 150 mg via INTRAMUSCULAR

## 2013-02-26 NOTE — Progress Notes (Signed)
Pt here for depo shot only.  Pt was not seen by a provider.  Clinical support visit only.

## 2013-05-17 ENCOUNTER — Ambulatory Visit (INDEPENDENT_AMBULATORY_CARE_PROVIDER_SITE_OTHER): Payer: PRIVATE HEALTH INSURANCE | Admitting: Physician Assistant

## 2013-05-17 VITALS — BP 116/78 | HR 73 | Temp 98.5°F | Resp 16 | Ht 64.25 in | Wt 155.2 lb

## 2013-05-17 DIAGNOSIS — E049 Nontoxic goiter, unspecified: Secondary | ICD-10-CM

## 2013-05-17 DIAGNOSIS — Z3042 Encounter for surveillance of injectable contraceptive: Secondary | ICD-10-CM

## 2013-05-17 DIAGNOSIS — Z3049 Encounter for surveillance of other contraceptives: Secondary | ICD-10-CM

## 2013-05-17 DIAGNOSIS — Z124 Encounter for screening for malignant neoplasm of cervix: Secondary | ICD-10-CM

## 2013-05-17 DIAGNOSIS — Z23 Encounter for immunization: Secondary | ICD-10-CM

## 2013-05-17 DIAGNOSIS — F17200 Nicotine dependence, unspecified, uncomplicated: Secondary | ICD-10-CM

## 2013-05-17 DIAGNOSIS — E7800 Pure hypercholesterolemia, unspecified: Secondary | ICD-10-CM

## 2013-05-17 DIAGNOSIS — E010 Iodine-deficiency related diffuse (endemic) goiter: Secondary | ICD-10-CM

## 2013-05-17 DIAGNOSIS — Z720 Tobacco use: Secondary | ICD-10-CM

## 2013-05-17 LAB — LIPID PANEL: Cholesterol: 161 mg/dL (ref 0–200)

## 2013-05-17 LAB — TSH: TSH: 0.407 u[IU]/mL (ref 0.350–4.500)

## 2013-05-17 MED ORDER — MEDROXYPROGESTERONE ACETATE 150 MG/ML IM SUSP
150.0000 mg | Freq: Once | INTRAMUSCULAR | Status: AC
  Administered 2013-05-17: 150 mg via INTRAMUSCULAR

## 2013-05-17 NOTE — Progress Notes (Signed)
Subjective:    Patient ID: Lacey Wu, female    DOB: 1980-09-03, 32 y.o.   MRN: [id number]  PCP: Tally Due, MD, last CPE with Dr. Audria Nine 10/2011  Chief Complaint  Patient presents with  . Depo Shot   Medications, allergies, past medical history, surgical history, family history, social history and problem list reviewed and updated.  HPI Presents for Depo=Provera injection, but noted she was due for health maintenance items. Last visit was for her pap test 10/2011  Is not current with the flu vaccine, but will get one today. H/o elevated LDL, has been working on reducing fried food consumption. Continues to smoke, though is interested in quitting, "It's hard."  Used to chew gum, but "it's bad for my teeth."  Has recently had some cavities addressed from sugary gum use.  Review of Systems No chest pain, SOB, HA, dizziness, vision change, N/V, diarrhea, constipation, dysuria, urinary urgency or frequency, myalgias, arthralgias or rash.     Objective:   Physical Exam  Vitals reviewed. Constitutional: She is oriented to person, place, and time. Vital signs are normal. She appears well-developed and well-nourished. She is active and cooperative. No distress.  HENT:  Head: Normocephalic and atraumatic.  Right Ear: Hearing, tympanic membrane, external ear and ear canal normal. No foreign bodies.  Left Ear: Hearing, tympanic membrane, external ear and ear canal normal. No foreign bodies.  Nose: Nose normal.  Mouth/Throat: Uvula is midline, oropharynx is clear and moist and mucous membranes are normal. No oral lesions. Normal dentition. No dental abscesses or uvula swelling. No oropharyngeal exudate.  Eyes: Conjunctivae, EOM and lids are normal. Pupils are equal, round, and reactive to light. Right eye exhibits no discharge. Left eye exhibits no discharge. No scleral icterus.  Fundoscopic exam:      The right eye shows no arteriolar narrowing, no AV nicking, no exudate, no  hemorrhage and no papilledema.       The left eye shows no arteriolar narrowing, no AV nicking, no exudate, no hemorrhage and no papilledema.  Neck: Trachea normal, normal range of motion and full passive range of motion without pain. Neck supple. No spinous process tenderness and no muscular tenderness present. No mass and no thyromegaly present.  Cardiovascular: Normal rate, regular rhythm, normal heart sounds, intact distal pulses and normal pulses.   Pulmonary/Chest: Effort normal and breath sounds normal. She exhibits no tenderness and no retraction. Right breast exhibits no inverted nipple, no mass, no nipple discharge, no skin change and no tenderness. Left breast exhibits no inverted nipple, no mass, no nipple discharge, no skin change and no tenderness. Breasts are symmetrical.  Abdominal: Soft. Normal appearance and bowel sounds are normal. She exhibits no distension and no mass. There is no hepatosplenomegaly. There is no tenderness. There is no rigidity, no rebound, no guarding, no CVA tenderness, no tenderness at McBurney's point and negative Murphy's sign. No hernia. Hernia confirmed negative in the right inguinal area and confirmed negative in the left inguinal area.  Genitourinary: Rectum normal, vagina normal and uterus normal. Rectal exam shows no external hemorrhoid and no fissure. No breast swelling, tenderness, discharge or bleeding. Pelvic exam was performed with patient supine. No labial fusion. There is no rash, tenderness, lesion or injury on the right labia. There is no rash, tenderness, lesion or injury on the left labia. Cervix exhibits no motion tenderness, no discharge and no friability. Right adnexum displays no mass, no tenderness and no fullness. Left adnexum displays no mass,  no tenderness and no fullness. No erythema, tenderness or bleeding around the vagina. No foreign body around the vagina. No signs of injury around the vagina. No vaginal discharge found.    Musculoskeletal: She exhibits no edema and no tenderness.       Cervical back: Normal.       Thoracic back: Normal.       Lumbar back: Normal.  Lymphadenopathy:       Head (right side): No tonsillar, no preauricular, no posterior auricular and no occipital adenopathy present.       Head (left side): No tonsillar, no preauricular, no posterior auricular and no occipital adenopathy present.    She has no cervical adenopathy.    She has no axillary adenopathy.       Right: No inguinal and no supraclavicular adenopathy present.       Left: No inguinal and no supraclavicular adenopathy present.  Neurological: She is alert and oriented to person, place, and time. She has normal strength and normal reflexes. No cranial nerve deficit. She exhibits normal muscle tone. Coordination and gait normal.  Skin: Skin is warm, dry and intact. No rash noted. She is not diaphoretic. No cyanosis or erythema. Nails show no clubbing.  Psychiatric: She has a normal mood and affect. Her speech is normal and behavior is normal. Judgment and thought content normal.          Assessment & Plan:  1. Surveillance for Depo-Provera contraception Happy with this method.  Continue Q3 months, with annual wellness exam. - medroxyPROGESTERone (DEPO-PROVERA) injection 150 mg; Inject 1 mL (150 mg total) into the muscle once.  2. Screening for cervical cancer If both pap cytology and HPV are negative, repeat in 5 years. - Pap IG and HPV (high risk) DNA detection  3. Need for influenza vaccination - Flu Vaccine QUAD 36+ mos IM  4. Thyromegaly Has had normal TSH. -TSH  5. Tobacco user Encouraged smoking cessation.  Consider sugar-free gum.  6. Elevated LDL cholesterol level Update lipids, continue efforts for healthy eating and regular exercise. - Lipid panel  Return in about 3 months (around 08/15/2013) for depo-provera injection.  Fernande Bras, PA-C Physician Assistant-Certified [facility]

## 2013-05-17 NOTE — Patient Instructions (Signed)
I will contact you with your lab results as soon as they are available.   If you have not heard from me in 2 weeks, please contact me.  The fastest way to get your results is to register for My Chart (see the instructions on the last page of this printout).  Keeping You Healthy  Get These Tests 1. Blood Pressure- Have your blood pressure checked once a year by your health care provider.  Normal blood pressure is 120/80. 2. Weight- Have your body mass index (BMI) calculated to screen for obesity.  BMI is measure of body fat based on height and weight.  You can also calculate your own BMI at www.nhlbisupport.com/bmi/. 3. Cholesterol- Have your cholesterol checked every 5 years starting at age 20 then yearly starting at age 45. 4. Chlamydia, HIV, and other sexually transmitted diseases- Get screened every year until age 25, then within three months of each new sexual provider. 5. Pap Smear- Every 1-3 years; discuss with your health care provider. 6. Mammogram- Every year starting at age 40  Take these medicines  Calcium with Vitamin D-Your body needs 1200 mg of Calcium each day and 800-1000 IU of Vitamin D daily.  Your body can only absorb 500 mg of Calcium at a time so Calcium must be taken in 2 or 3 divided doses throughout the day.  Multivitamin with folic acid- Once daily if it is possible for you to become pregnant.  Get these Immunizations  Gardasil-Series of three doses; prevents HPV related illness such as genital warts and cervical cancer.  Menactra-Single dose; prevents meningitis.  Tetanus shot- Every 10 years.  Flu shot-Every year.  Take these steps 1. Do not smoke-Your healthcare provider can help you quit.  For tips on how to quit go to www.smokefree.gov or call 1-800 QUITNOW. 2. Be physically active- Exercise 5 days a week for at least 30 minutes.  If you are not already physically active, start slow and gradually work up to 30 minutes of moderate physical activity.   Examples of moderate activity include walking briskly, dancing, swimming, bicycling, etc. 3. Breast Cancer- A self breast exam every month is important for early detection of breast cancer.  For more information and instruction on self breast exams, ask your healthcare provider or www.womenshealth.gov/faq/breast-self-exam.cfm. 4. Eat a healthy diet- Eat a variety of healthy foods such as fruits, vegetables, whole grains, low fat milk, low fat cheeses, yogurt, lean meats, poultry and fish, beans, nuts, tofu, etc.  For more information go to www. Thenutritionsource.org 5. Drink alcohol in moderation- Limit alcohol intake to one drink or less per day. Never drink and drive. 6. Depression- Your emotional health is as important as your physical health.  If you're feeling down or losing interest in things you normally enjoy please talk to your healthcare provider about being screened for depression. 7. Dental visit- Brush and floss your teeth twice daily; visit your dentist twice a year. 8. Eye doctor- Get an eye exam at least every 2 years. 9. Helmet use- Always wear a helmet when riding a bicycle, motorcycle, rollerblading or skateboarding. 10. Safe sex- If you may be exposed to sexually transmitted infections, use a condom. 11. Seat belts- Seat belts can save your live; always wear one. 12. Smoke/Carbon Monoxide detectors- These detectors need to be installed on the appropriate level of your home. Replace batteries at least once a year. 13. Skin cancer- When out in the sun please cover up and use sunscreen 15 SPF or higher.   14. Violence- If anyone is threatening or hurting you, please tell your healthcare provider.        

## 2013-05-21 LAB — PAP IG AND HPV HIGH-RISK

## 2013-05-22 ENCOUNTER — Encounter: Payer: Self-pay | Admitting: Physician Assistant

## 2013-08-06 ENCOUNTER — Ambulatory Visit (INDEPENDENT_AMBULATORY_CARE_PROVIDER_SITE_OTHER): Payer: BC Managed Care – PPO | Admitting: *Deleted

## 2013-08-06 DIAGNOSIS — Z3049 Encounter for surveillance of other contraceptives: Secondary | ICD-10-CM

## 2013-08-06 MED ORDER — MEDROXYPROGESTERONE ACETATE 150 MG/ML IM SUSP
150.0000 mg | Freq: Once | INTRAMUSCULAR | Status: AC
  Administered 2013-08-06: 150 mg via INTRAMUSCULAR

## 2013-08-06 NOTE — Progress Notes (Signed)
   Subjective:    Patient ID: Lacey Wu, female    DOB: [birth year], 33 y.o.   MRN: [id number]  HPI Pt here for a depo provera injection. She is within the time period. [facility]    Review of Systems     Objective:   Physical Exam        Assessment & Plan:

## 2013-10-18 ENCOUNTER — Ambulatory Visit (INDEPENDENT_AMBULATORY_CARE_PROVIDER_SITE_OTHER): Payer: BC Managed Care – PPO

## 2013-10-23 ENCOUNTER — Ambulatory Visit (INDEPENDENT_AMBULATORY_CARE_PROVIDER_SITE_OTHER): Payer: BC Managed Care – PPO | Admitting: Physician Assistant

## 2013-10-23 VITALS — Temp 98.4°F

## 2013-10-23 DIAGNOSIS — Z309 Encounter for contraceptive management, unspecified: Secondary | ICD-10-CM

## 2013-10-23 MED ORDER — MEDROXYPROGESTERONE ACETATE 150 MG/ML IM SUSP
150.0000 mg | Freq: Once | INTRAMUSCULAR | Status: AC
  Administered 2013-10-23: 150 mg via INTRAMUSCULAR

## 2013-10-23 NOTE — Progress Notes (Signed)
Patient here for Depo injection. She is within her window - ok to give. Not seen by a provider today

## 2013-10-23 NOTE — Progress Notes (Signed)
   Subjective:    Patient ID: Lacey Wu, female    DOB: Mar 04, 1981, 33 y.o.   MRN: [id number]  HPI Patient her for Depo-Provera injection only, she is within scheduled time and Rhoderick MoodyHeather Marte PA-C ok'd for injection.    Review of Systems     Objective:   Physical Exam        Assessment & Plan:

## 2014-01-08 ENCOUNTER — Ambulatory Visit (INDEPENDENT_AMBULATORY_CARE_PROVIDER_SITE_OTHER): Payer: BC Managed Care – PPO

## 2014-01-08 DIAGNOSIS — Z3042 Encounter for surveillance of injectable contraceptive: Secondary | ICD-10-CM

## 2014-01-08 DIAGNOSIS — Z3049 Encounter for surveillance of other contraceptives: Secondary | ICD-10-CM

## 2014-01-08 MED ORDER — MEDROXYPROGESTERONE ACETATE 150 MG/ML IM SUSP
150.0000 mg | Freq: Once | INTRAMUSCULAR | Status: AC
  Administered 2014-01-08: 150 mg via INTRAMUSCULAR

## 2014-01-08 NOTE — Progress Notes (Unsigned)
   Subjective:    Patient ID: Lacey Wu, female    DOB: 04/10/1981, 33 y.o.   MRN: [id number]  HPI Pt here for Depo Provera only. She came within the scheduled time frame.     Review of Systems     Objective:   Physical Exam        Assessment & Plan:   

## 2014-03-26 ENCOUNTER — Ambulatory Visit (INDEPENDENT_AMBULATORY_CARE_PROVIDER_SITE_OTHER): Payer: BC Managed Care – PPO | Admitting: Radiology

## 2014-03-26 DIAGNOSIS — Z309 Encounter for contraceptive management, unspecified: Secondary | ICD-10-CM

## 2014-03-26 MED ORDER — MEDROXYPROGESTERONE ACETATE 150 MG/ML IM SUSP
150.0000 mg | Freq: Once | INTRAMUSCULAR | Status: AC
  Administered 2014-03-26: 150 mg via INTRAMUSCULAR

## 2014-03-26 NOTE — Progress Notes (Signed)
   Subjective:    Patient ID: Lacey Wu, female    DOB: 10-01-1980, 33 y.o.   MRN: [id number]  HPI Pt here for Depo Provera only. She came within the scheduled time frame.     Review of Systems     Objective:   Physical Exam        Assessment & Plan:

## 2014-06-12 ENCOUNTER — Ambulatory Visit (INDEPENDENT_AMBULATORY_CARE_PROVIDER_SITE_OTHER): Payer: BLUE CROSS/BLUE SHIELD | Admitting: *Deleted

## 2014-06-12 DIAGNOSIS — Z3042 Encounter for surveillance of injectable contraceptive: Secondary | ICD-10-CM

## 2014-06-12 MED ORDER — MEDROXYPROGESTERONE ACETATE 150 MG/ML IM SUSP
150.0000 mg | Freq: Once | INTRAMUSCULAR | Status: AC
  Administered 2014-06-12: 150 mg via INTRAMUSCULAR

## 2014-06-12 NOTE — Progress Notes (Signed)
   Subjective:    Patient ID: Lacey Wu, female    DOB: 09/20/1980, 33 y.o.   MRN: [id number]  HPI Pt here for Depo Provera only. She came within the scheduled time frame.     Review of Systems     Objective:   Physical Exam        Assessment & Plan:   

## 2014-08-29 ENCOUNTER — Ambulatory Visit (INDEPENDENT_AMBULATORY_CARE_PROVIDER_SITE_OTHER): Payer: BLUE CROSS/BLUE SHIELD | Admitting: Radiology

## 2014-08-29 DIAGNOSIS — Z3042 Encounter for surveillance of injectable contraceptive: Secondary | ICD-10-CM

## 2014-08-29 MED ORDER — MEDROXYPROGESTERONE ACETATE 150 MG/ML IM SUSP
150.0000 mg | Freq: Once | INTRAMUSCULAR | Status: AC
  Administered 2014-08-29: 150 mg via INTRAMUSCULAR

## 2014-11-13 ENCOUNTER — Ambulatory Visit (INDEPENDENT_AMBULATORY_CARE_PROVIDER_SITE_OTHER): Payer: BLUE CROSS/BLUE SHIELD | Admitting: Physician Assistant

## 2014-11-13 VITALS — BP 120/70 | HR 69 | Temp 98.6°F | Resp 17 | Ht 64.0 in | Wt 165.2 lb

## 2014-11-13 DIAGNOSIS — R112 Nausea with vomiting, unspecified: Secondary | ICD-10-CM

## 2014-11-13 DIAGNOSIS — R197 Diarrhea, unspecified: Secondary | ICD-10-CM

## 2014-11-13 DIAGNOSIS — Z3042 Encounter for surveillance of injectable contraceptive: Secondary | ICD-10-CM

## 2014-11-13 LAB — POCT URINE PREGNANCY: Preg Test, Ur: NEGATIVE

## 2014-11-13 MED ORDER — MEDROXYPROGESTERONE ACETATE 150 MG/ML IM SUSP
150.0000 mg | Freq: Once | INTRAMUSCULAR | Status: AC
  Administered 2014-11-13: 150 mg via INTRAMUSCULAR

## 2014-11-13 NOTE — Progress Notes (Signed)
[facility] [address] [place] [address] [phone]  Date:  11/13/2014   Name:  Lacey Wu   DOB:  February 05, 1981   MRN:  [id number]  PCP:  Tally Due, MD    History of Present Illness:  Lacey Wu is a 34 y.o. female patient who presents to [facility] for previous history of stomach cramps when she got into work. She then had one episode of emesis that was nonbloody and clear. Then followed 2 episodes of diarrhea which were also nonbloody. The symptoms have now resolved. She currently has no nausea. Pain was along her lower abdomen. She denies dysuria, frequency, or hematuria. There is no fever. She has no malaise. All of her symptoms have now spontaneously resolved.  She does not have any associated heartburn. Patient reports having fast food the night before from cookout. No other person eating complained of similar sxs.  She works in [employer], where it is extremely hot.   She is also requesting a Depo-Provera injection.  Last menstrual period was months ago. She denies abnormal discharge, odor, or rash. She has not had any constipation.    Patient Active Problem List   Diagnosis Date Noted  . Thyromegaly   . Surveillance for Depo-Provera contraception 10/19/2011  . Contraception management 10/19/2011  . Tobacco user 10/19/2011    Past Medical History  Diagnosis Date  . Thyromegaly     History reviewed. No pertinent past surgical history.  History  Substance Use Topics  . Smoking status: Current Every Day Smoker -- 0.50 packs/day for 10 years    Types: Cigarettes  . Smokeless tobacco: Not on file     Comment: Thinking about quitting; smokes after eating  . Alcohol Use: No    Family History  Problem Relation Age of Onset  . Asthma Mother   . Asthma Father   . Hypertension Father   . Hyperlipidemia Brother   . Asthma Brother   . Diabetes Maternal Grandmother   . Diabetes Paternal Grandmother   . Hyperlipidemia Paternal Grandmother      No Known Allergies  Medication list has been reviewed and updated.  Current Outpatient Prescriptions on File Prior to Visit  Medication Sig Dispense Refill  . medroxyPROGESTERone (DEPO-PROVERA) 150 MG/ML injection Inject 1 mL (150 mg total) into the muscle every 3 (three) months. 1 mL 3  . dicyclomine (BENTYL) 20 MG tablet Take 1 tablet (20 mg total) by mouth every 6 (six) hours. 21 tablet 0   No current facility-administered medications on file prior to visit.    ROS ROS otherwise unremarkable unless listed above.  Physical Examination: BP 120/70 mmHg  Pulse 69  Temp(Src) 98.6 F (37 C) (Oral)  Resp 17  Ht  (1.626 m)  Wt 165 lb 3.2 oz (74.934 kg)  BMI 28.34 kg/m2  SpO2 98% Ideal Body Weight: Weight in (lb) to have BMI = 25: 145.3  Physical Exam  Constitutional: She is oriented to person, place, and time. She appears well-developed and well-nourished. No distress.  HENT:  Head: Normocephalic and atraumatic.  Eyes: EOM are normal. Pupils are equal, round, and reactive to light.  Neck: Normal range of motion. Neck supple. No thyromegaly present.  Cardiovascular: Normal rate, regular rhythm, normal heart sounds and intact distal pulses.  Exam reveals no friction rub.   No murmur heard. Pulmonary/Chest: Effort normal and breath sounds normal. No respiratory distress. She has no wheezes.  Abdominal: Soft. Normal appearance and  bowel sounds are normal. She exhibits no distension. There is no tenderness. There is no tenderness at McBurney's point and negative Murphy's sign.  Neurological: She is alert and oriented to person, place, and time. No cranial nerve deficit. Coordination normal.  Skin: Skin is warm and dry. No erythema. No pallor.  Psychiatric: She has a normal mood and affect. Her behavior is normal.     Assessment and Plan: 34 year old female is here today for abdominal cramping followed by emesis and diarrhea.  She had one bowel movement at the office,  which she reports that her stools are forming again.  She has no nausea, and declines an anti-emetic at visit, or prescription.  I have advised her to hydrate and instructed on some foods to eat following diarrhea.  This is likely GI virus, contaminated food, or possible over-heated.   Advised to return if her symptoms do not resolve.  -Depoprovera shot given--advised to rtc aug 24-sept 7 for next injection    Encounter for Depo-Provera contraception - Plan: POCT urine pregnancy, medroxyPROGESTERone (DEPO-PROVERA) injection 150 mg  Diarrhea  Non-intractable vomiting with nausea, vomiting of unspecified type    Trena PlattStephanie Maybel Dambrosio, PA-C [facility] 11/13/2014 10:29 AM

## 2014-11-13 NOTE — Patient Instructions (Signed)
Your next depo shot will be during the window of August 24-September 7.  Food Choices to Help Relieve Diarrhea When you have diarrhea, the foods you eat and your eating habits are very important. Choosing the right foods and drinks can help relieve diarrhea. Also, because diarrhea can last up to 7 days, you need to replace lost fluids and electrolytes (such as sodium, potassium, and chloride) in order to help prevent dehydration.  WHAT GENERAL GUIDELINES DO I NEED TO FOLLOW?  Slowly drink 1 cup (8 oz) of fluid for each episode of diarrhea. If you are getting enough fluid, your urine will be clear or pale yellow.  Eat starchy foods. Some good choices include white rice, white toast, pasta, low-fiber cereal, baked potatoes (without the skin), saltine crackers, and bagels.  Avoid large servings of any cooked vegetables.  Limit fruit to two servings per day. A serving is  cup or 1 small piece.  Choose foods with less than 2 g of fiber per serving.  Limit fats to less than 8 tsp (38 g) per day.  Avoid fried foods.  Eat foods that have probiotics in them. Probiotics can be found in certain dairy products.  Avoid foods and beverages that may increase the speed at which food moves through the stomach and intestines (gastrointestinal tract). Things to avoid include:  High-fiber foods, such as dried fruit, raw fruits and vegetables, nuts, seeds, and whole grain foods.  Spicy foods and high-fat foods.  Foods and beverages sweetened with high-fructose corn syrup, honey, or sugar alcohols such as xylitol, sorbitol, and mannitol. WHAT FOODS ARE RECOMMENDED? Grains White rice. White, [place], or pita breads (fresh or toasted), including plain rolls, buns, or bagels. White pasta. Saltine, soda, or graham crackers. Pretzels. Low-fiber cereal. Cooked cereals made with water (such as cornmeal, farina, or cream cereals). Plain muffins. Matzo. Melba toast. Zwieback.  Vegetables Potatoes (without the  skin). Strained tomato and vegetable juices. Most well-cooked and canned vegetables without seeds. Tender lettuce. Fruits Cooked or canned applesauce, apricots, cherries, fruit cocktail, grapefruit, peaches, pears, or plums. Fresh bananas, apples without skin, cherries, grapes, cantaloupe, grapefruit, peaches, oranges, or plums.  Meat and Other Protein Products Baked or boiled chicken. Eggs. Tofu. Fish. Seafood. Smooth peanut butter. Ground or well-cooked tender beef, ham, veal, lamb, pork, or poultry.  Dairy Plain yogurt, kefir, and unsweetened liquid yogurt. Lactose-free milk, buttermilk, or soy milk. Plain hard cheese. Beverages Sport drinks. Clear broths. Diluted fruit juices (except prune). Regular, caffeine-free sodas such as ginger ale. Water. Decaffeinated teas. Oral rehydration solutions. Sugar-free beverages not sweetened with sugar alcohols. Other Bouillon, broth, or soups made from recommended foods.  The items listed above may not be a complete list of recommended foods or beverages. Contact your dietitian for more options. WHAT FOODS ARE NOT RECOMMENDED? Grains Whole grain, whole wheat, bran, or rye breads, rolls, pastas, crackers, and cereals. Wild or brown rice. Cereals that contain more than 2 g of fiber per serving. Corn tortillas or taco shells. Cooked or dry oatmeal. Granola. Popcorn. Vegetables Raw vegetables. Cabbage, broccoli, Brussels sprouts, artichokes, baked beans, beet greens, corn, kale, legumes, peas, sweet potatoes, and yams. Potato skins. Cooked spinach and cabbage. Fruits Dried fruit, including raisins and dates. Raw fruits. Stewed or dried prunes. Fresh apples with skin, apricots, mangoes, pears, raspberries, and strawberries.  Meat and Other Protein Products Chunky peanut butter. Nuts and seeds. Beans and lentils. Tomasa Blase.  Dairy High-fat cheeses. Milk, chocolate milk, and beverages made with milk, such as milk  shakes. Cream. Ice cream. Sweets and  Desserts Sweet rolls, doughnuts, and sweet breads. Pancakes and waffles. Fats and Oils Butter. Cream sauces. Margarine. Salad oils. Plain salad dressings. Olives. Avocados.  Beverages Caffeinated beverages (such as coffee, tea, soda, or energy drinks). Alcoholic beverages. Fruit juices with pulp. Prune juice. Soft drinks sweetened with high-fructose corn syrup or sugar alcohols. Other Coconut. Hot sauce. Chili powder. Mayonnaise. Gravy. Cream-based or milk-based soups.  The items listed above may not be a complete list of foods and beverages to avoid. Contact your dietitian for more information. WHAT SHOULD I DO IF I BECOME DEHYDRATED? Diarrhea can sometimes lead to dehydration. Signs of dehydration include dark urine and dry mouth and skin. If you think you are dehydrated, you should rehydrate with an oral rehydration solution. These solutions can be purchased at pharmacies, retail stores, or online.  Drink -1 cup (120-240 mL) of oral rehydration solution each time you have an episode of diarrhea. If drinking this amount makes your diarrhea worse, try drinking smaller amounts more often. For example, drink 1-3 tsp (5-15 mL) every 5-10 minutes.  A general rule for staying hydrated is to drink 1-2 L of fluid per day. Talk to your health care provider about the specific amount you should be drinking each day. Drink enough fluids to keep your urine clear or pale yellow. Document Released: 08/14/2003 Document Revised: 05/29/2013 Document Reviewed: 04/16/2013 [facility] Patient Information 2015 [employer] [place]. This information is not intended to replace advice given to you by your health care provider. Make sure you discuss any questions you have with your health care provider.

## 2015-01-29 ENCOUNTER — Ambulatory Visit (INDEPENDENT_AMBULATORY_CARE_PROVIDER_SITE_OTHER): Payer: BLUE CROSS/BLUE SHIELD | Admitting: Radiology

## 2015-01-29 DIAGNOSIS — Z3042 Encounter for surveillance of injectable contraceptive: Secondary | ICD-10-CM

## 2015-01-29 MED ORDER — MEDROXYPROGESTERONE ACETATE 150 MG/ML IM SUSP
150.0000 mg | Freq: Once | INTRAMUSCULAR | Status: AC
  Administered 2015-01-29: 150 mg via INTRAMUSCULAR

## 2015-04-17 ENCOUNTER — Ambulatory Visit (INDEPENDENT_AMBULATORY_CARE_PROVIDER_SITE_OTHER): Payer: BLUE CROSS/BLUE SHIELD

## 2015-04-17 VITALS — BP 118/76 | HR 65 | Temp 98.6°F | Resp 16 | Ht 63.0 in | Wt 166.0 lb

## 2015-04-17 DIAGNOSIS — Z3042 Encounter for surveillance of injectable contraceptive: Secondary | ICD-10-CM | POA: Diagnosis not present

## 2015-04-17 MED ORDER — MEDROXYPROGESTERONE ACETATE 150 MG/ML IM SUSP
150.0000 mg | Freq: Once | INTRAMUSCULAR | Status: AC
  Administered 2015-04-17: 150 mg via INTRAMUSCULAR

## 2015-06-18 ENCOUNTER — Telehealth: Payer: Self-pay

## 2015-06-18 NOTE — Telephone Encounter (Signed)
Pt is needing to know when the next depo shot is due  Best number [phone]

## 2015-06-18 NOTE — Telephone Encounter (Signed)
Jan 26-Feb 9.  Pt advised.

## 2015-07-03 ENCOUNTER — Ambulatory Visit (INDEPENDENT_AMBULATORY_CARE_PROVIDER_SITE_OTHER): Payer: BLUE CROSS/BLUE SHIELD | Admitting: *Deleted

## 2015-07-03 DIAGNOSIS — Z3042 Encounter for surveillance of injectable contraceptive: Secondary | ICD-10-CM | POA: Diagnosis not present

## 2015-07-03 MED ORDER — MEDROXYPROGESTERONE ACETATE 150 MG/ML IM SUSY
150.0000 mg | PREFILLED_SYRINGE | Freq: Once | INTRAMUSCULAR | Status: AC
  Administered 2015-07-03: 150 mg via INTRAMUSCULAR

## 2015-07-03 MED ORDER — MEDROXYPROGESTERONE ACETATE 150 MG/ML IM SUSY: 150.0000 mg | PREFILLED_SYRINGE | Freq: Once | INTRAMUSCULAR | Status: DC

## 2015-07-03 NOTE — Progress Notes (Signed)
   Subjective:    Patient ID: Lacey Wu, female    DOB: Jan 04, 1981, 35 y.o.   MRN: [id number]  HPI Patient is here for her Depo shot.  Injection was given in her left upper outer quadrant.  Patient tolerated well.  Patient was also given print-out of next depo schedule.   Review of Systems     Objective:   Physical Exam        Assessment & Plan:

## 2015-08-30 ENCOUNTER — Ambulatory Visit (INDEPENDENT_AMBULATORY_CARE_PROVIDER_SITE_OTHER): Payer: BLUE CROSS/BLUE SHIELD | Admitting: Physician Assistant

## 2015-08-30 ENCOUNTER — Ambulatory Visit (INDEPENDENT_AMBULATORY_CARE_PROVIDER_SITE_OTHER): Payer: BLUE CROSS/BLUE SHIELD

## 2015-08-30 VITALS — BP 100/60 | HR 77 | Temp 97.7°F | Resp 16 | Ht 64.0 in | Wt 167.0 lb

## 2015-08-30 DIAGNOSIS — E049 Nontoxic goiter, unspecified: Secondary | ICD-10-CM | POA: Diagnosis not present

## 2015-08-30 DIAGNOSIS — J020 Streptococcal pharyngitis: Secondary | ICD-10-CM | POA: Diagnosis not present

## 2015-08-30 DIAGNOSIS — R1310 Dysphagia, unspecified: Secondary | ICD-10-CM | POA: Diagnosis not present

## 2015-08-30 DIAGNOSIS — E010 Iodine-deficiency related diffuse (endemic) goiter: Secondary | ICD-10-CM

## 2015-08-30 LAB — POCT RAPID STREP A (OFFICE): RAPID STREP A SCREEN: POSITIVE — AB

## 2015-08-30 MED ORDER — AMOXICILLIN 400 MG/5ML PO SUSR: 875.0000 mg | Freq: Two times a day (BID) | ORAL | Status: AC

## 2015-08-30 NOTE — Progress Notes (Signed)
Lacey Wu  MRN: [id number] DOB: 07-Nov-1980  Subjective:  Pt presents to clinic with difficulty swallowing for the last 2 days.  She has no pain with swallowing.  About 1.5 years ago she was diagnosed with thyromegaly and last saw the endocrinologist at that time.  She finds that her thyroid gets bigger and smaller at times.  She finds that when it is bigger she has more awareness of swallowing changes - she is unable to swallow pills well.  She has no other symptoms, no congestion, no F/C and no cough.  She is able to swallow liquids without a problem.  She notices that her swallowing changes when her thyroid gets larger.  Patient Active Problem List   Diagnosis Date Noted  . Thyromegaly   . Surveillance for Depo-Provera contraception 10/19/2011  . Contraception management 10/19/2011  . Tobacco user 10/19/2011    Current Outpatient Prescriptions on File Prior to Visit  Medication Sig Dispense Refill  . medroxyPROGESTERone (DEPO-PROVERA) 150 MG/ML injection Inject 1 mL (150 mg total) into the muscle every 3 (three) months. 1 mL 3  . dicyclomine (BENTYL) 20 MG tablet Take 1 tablet (20 mg total) by mouth every 6 (six) hours. 21 tablet 0   No current facility-administered medications on file prior to visit.    No Known Allergies  Review of Systems  Constitutional: Negative for fever and chills.  HENT: Positive for sore throat. Negative for congestion.   Respiratory: Negative for cough.   Gastrointestinal: Negative.   Musculoskeletal: Negative for myalgias.  Neurological: Negative for headaches.   Objective:  BP 100/60 mmHg  Pulse 77  Temp(Src) 97.7 F (36.5 C) (Oral)  Resp 16  Ht  (1.626 m)  Wt 167 lb (75.751 kg)  BMI 28.65 kg/m2  SpO2 98%  Physical Exam  Constitutional: She is oriented to person, place, and time and well-developed, well-nourished, and in no distress.  HENT:  Head: Normocephalic and atraumatic.  Right Ear: Hearing, tympanic membrane, external  ear and ear canal normal.  Left Ear: Hearing, tympanic membrane, external ear and ear canal normal.  Nose: Nose normal.  Mouth/Throat: Uvula is midline and mucous membranes are normal. Posterior oropharyngeal erythema (mild) present. No oropharyngeal exudate or posterior oropharyngeal edema.  Eyes: Conjunctivae are normal.  Neck: Normal range of motion. Thyromegaly present.  Cardiovascular: Normal rate, regular rhythm and normal heart sounds.   No murmur heard. Pulmonary/Chest: Effort normal and breath sounds normal.  Lymphadenopathy:       Head (right side): No submandibular and no occipital adenopathy present.       Head (left side): No submandibular and no occipital adenopathy present.    She has no cervical adenopathy.       Right: No supraclavicular adenopathy present.       Left: No supraclavicular adenopathy present.  Neurological: She is alert and oriented to person, place, and time. Gait normal.  Skin: Skin is warm and dry.  Psychiatric: Mood, memory, affect and judgment normal.  Vitals reviewed.  Dg Neck Soft Tissue  08/30/2015  CLINICAL DATA:  Trouble swallowing EXAM: NECK SOFT TISSUES - 1+ VIEW COMPARISON:  None. FINDINGS: There is no evidence of retropharyngeal soft tissue swelling or epiglottic enlargement. The cervical airway is unremarkable and no radio-opaque foreign body identified. IMPRESSION: Negative. Electronically Signed   By: Charline Bills M.D.   On: 08/30/2015 16:43    Results for orders placed or performed in visit on 08/30/15  POCT rapid strep A  Result  Value Ref Range   Rapid Strep A Screen Positive (A) Negative   Assessment and Plan :  Difficulty swallowing - Plan: POCT rapid strep A, DG Neck Soft Tissue  Streptococcal sore throat - Plan: amoxicillin (AMOXIL) 400 MG/5ML suspension - treat with abx  Thyromegaly - f/u with endo   Benny LennertSarah Weber PA-C  [facility] 08/30/2015 4:55 PM

## 2015-08-30 NOTE — Patient Instructions (Signed)
     IF you received an x-ray today, you will receive an invoice from [facility]. Please contact [facility] at [phone] with questions or concerns regarding your invoice.   IF you received labwork today, you will receive an invoice from [facility]. Please contact [facility] at [phone] with questions or concerns regarding your invoice.   Our billing staff will not be able to assist you with questions regarding bills from these companies.  You will be contacted with the lab results as soon as they are available. The fastest way to get your results is to activate your My Chart account. Instructions are located on the last page of this paperwork. If you have not heard from us regarding the results in 2 weeks, please contact this office.      

## 2015-09-24 ENCOUNTER — Ambulatory Visit (INDEPENDENT_AMBULATORY_CARE_PROVIDER_SITE_OTHER): Payer: BLUE CROSS/BLUE SHIELD

## 2015-09-24 DIAGNOSIS — Z3042 Encounter for surveillance of injectable contraceptive: Secondary | ICD-10-CM | POA: Diagnosis not present

## 2015-09-24 MED ORDER — MEDROXYPROGESTERONE ACETATE 150 MG/ML IM SUSP
150.0000 mg | Freq: Once | INTRAMUSCULAR | Status: AC
  Administered 2015-09-24: 150 mg via INTRAMUSCULAR

## 2015-09-24 NOTE — Progress Notes (Signed)
35 year old female presents for Depo Provera injection.   Last injection date 03 Jul 2015 - pt is within the appropriate date.  Pt given Depo Provera injection in right upper quadrant. Pt given calendar for next injection date range.

## 2015-12-17 ENCOUNTER — Ambulatory Visit (INDEPENDENT_AMBULATORY_CARE_PROVIDER_SITE_OTHER): Payer: PRIVATE HEALTH INSURANCE | Admitting: Family Medicine

## 2015-12-17 VITALS — BP 120/66 | HR 75 | Temp 98.2°F | Resp 16 | Ht 63.0 in | Wt 169.0 lb

## 2015-12-17 DIAGNOSIS — Z309 Encounter for contraceptive management, unspecified: Secondary | ICD-10-CM

## 2015-12-17 DIAGNOSIS — L03116 Cellulitis of left lower limb: Secondary | ICD-10-CM | POA: Diagnosis not present

## 2015-12-17 MED ORDER — MEDROXYPROGESTERONE ACETATE 150 MG/ML IM SUSP
150.0000 mg | Freq: Once | INTRAMUSCULAR | Status: AC
  Administered 2015-12-17: 150 mg via INTRAMUSCULAR

## 2015-12-17 MED ORDER — AMOXICILLIN 250 MG PO CHEW: 500.0000 mg | CHEWABLE_TABLET | Freq: Three times a day (TID) | ORAL | Status: DC

## 2015-12-17 NOTE — Patient Instructions (Addendum)
   Great to meet you!  I have sent amoxicillin to your pharmacy to cover for any developing infection.   You may want to a  Daily antihistamine, Zyrtec or claritin one pill once a day is a good choice.   Cellulitis Cellulitis is an infection of the skin and the tissue beneath it. The infected area is usually red and tender. Cellulitis occurs most often in the arms and lower legs.  CAUSES  Cellulitis is caused by bacteria that enter the skin through cracks or cuts in the skin. The most common types of bacteria that cause cellulitis are staphylococci and streptococci. SIGNS AND SYMPTOMS   Redness and warmth.  Swelling.  Tenderness or pain.  Fever. DIAGNOSIS  Your health care provider can usually determine what is wrong based on a physical exam. Blood tests may also be done. TREATMENT  Treatment usually involves taking an antibiotic medicine. HOME CARE INSTRUCTIONS   Take your antibiotic medicine as directed by your health care provider. Finish the antibiotic even if you start to feel better.  Keep the infected arm or leg elevated to reduce swelling.  Apply a warm cloth to the affected area up to 4 times per day to relieve pain.  Take medicines only as directed by your health care provider.  Keep all follow-up visits as directed by your health care provider. SEEK MEDICAL CARE IF:   You notice red streaks coming from the infected area.  Your red area gets larger or turns dark in color.  Your bone or joint underneath the infected area becomes painful after the skin has healed.  Your infection returns in the same area or another area.  You notice a swollen bump in the infected area.  You develop new symptoms.  You have a fever. SEEK IMMEDIATE MEDICAL CARE IF:   You feel very sleepy.  You develop vomiting or diarrhea.  You have a general ill feeling (malaise) with muscle aches and pains.   This information is not intended to replace advice given to you by your  health care provider. Make sure you discuss any questions you have with your health care provider.   Document Released: 03/03/2005 Document Revised: 02/12/2015 Document Reviewed: 08/09/2011 Elsevier Interactive Patient Education 2016 [employer]   IF you received an x-ray today, you will receive an invoice from [facility]. Please contact [facility] at [phone] with questions or concerns regarding your invoice.   IF you received labwork today, you will receive an invoice from [facility]. Please contact [facility] at [phone] with questions or concerns regarding your invoice.   Our billing staff will not be able to assist you with questions regarding bills from these companies.  You will be contacted with the lab results as soon as they are available. The fastest way to get your results is to activate your My Chart account. Instructions are located on the last page of this paperwork. If you have not heard from us regarding the results in 2 weeks, please contact this office.

## 2015-12-17 NOTE — Progress Notes (Signed)
   HPI  Patient presents today here with a bug bite of the L medial ankle  Pt states that she developed a small lesion on the inside of her L ankle and developed swelling after that. The swelling worsened after being in her feet all day at work and then improved today.   She denies fever, chills, sweats and difficulty tolerating foods and fluids.   She is unsure but thinks it is about the same.   She has some tenderness to the touch.   PMH: Smoking status noted ROS: Per HPI  Objective: BP 120/66 mmHg  Pulse 75  Temp(Src) 98.2 F (36.8 C) (Oral)  Resp 16  Ht 5\' 3"  (1.6 m)  Wt 169 lb (76.658 kg)  BMI 29.94 kg/m2  SpO2 99% Gen: NAD, alert, cooperative with exam HEENT: NCAT CV: RRR, good S1/S2, no murmur Resp: CTABL, no wheezes, non-labored Ext: No edema, warm Neuro: Alert and oriented, No gross deficits  Skin:  L medial ankle with hyperpigmented flat lesion measuring 1.4 X 9 mm, surrounding area of erythema and induration measures 3.8 cm X3 cm No warmth or flutuance.   Assessment and plan:  # cellulitis Possibly started as arthropod bite but appears to be mild slow developing cellulitis currently Cover with amox, she requests liquid or chewable   Injection of depo provera also given for contraception.     Meds ordered this encounter  Medications  . amoxicillin (AMOXIL) 250 MG chewable tablet    Sig: Chew 2 tablets (500 mg total) by mouth 3 (three) times daily.    Dispense:  40 tablet    Refill:  0    Kevin FentonSamuel Bradshaw, MD 3:55 PM

## 2016-10-21 ENCOUNTER — Encounter: Payer: Self-pay | Admitting: Physician Assistant

## 2016-10-21 ENCOUNTER — Ambulatory Visit (INDEPENDENT_AMBULATORY_CARE_PROVIDER_SITE_OTHER): Payer: BLUE CROSS/BLUE SHIELD | Admitting: Physician Assistant

## 2016-10-21 VITALS — BP 114/71 | HR 81 | Temp 98.7°F | Resp 16 | Ht 63.0 in | Wt 173.6 lb

## 2016-10-21 DIAGNOSIS — B3731 Acute candidiasis of vulva and vagina: Secondary | ICD-10-CM

## 2016-10-21 DIAGNOSIS — Z130 Encounter for screening for diseases of the blood and blood-forming organs and certain disorders involving the immune mechanism: Secondary | ICD-10-CM | POA: Diagnosis not present

## 2016-10-21 DIAGNOSIS — Z13220 Encounter for screening for lipoid disorders: Secondary | ICD-10-CM | POA: Diagnosis not present

## 2016-10-21 DIAGNOSIS — L309 Dermatitis, unspecified: Secondary | ICD-10-CM | POA: Diagnosis not present

## 2016-10-21 DIAGNOSIS — B9689 Other specified bacterial agents as the cause of diseases classified elsewhere: Secondary | ICD-10-CM | POA: Diagnosis not present

## 2016-10-21 DIAGNOSIS — Z0000 Encounter for general adult medical examination without abnormal findings: Secondary | ICD-10-CM | POA: Diagnosis not present

## 2016-10-21 DIAGNOSIS — Z01419 Encounter for gynecological examination (general) (routine) without abnormal findings: Secondary | ICD-10-CM | POA: Diagnosis not present

## 2016-10-21 DIAGNOSIS — Z13228 Encounter for screening for other metabolic disorders: Secondary | ICD-10-CM

## 2016-10-21 DIAGNOSIS — N898 Other specified noninflammatory disorders of vagina: Secondary | ICD-10-CM

## 2016-10-21 DIAGNOSIS — R87810 Cervical high risk human papillomavirus (HPV) DNA test positive: Secondary | ICD-10-CM | POA: Diagnosis not present

## 2016-10-21 DIAGNOSIS — Z114 Encounter for screening for human immunodeficiency virus [HIV]: Secondary | ICD-10-CM

## 2016-10-21 DIAGNOSIS — Z113 Encounter for screening for infections with a predominantly sexual mode of transmission: Secondary | ICD-10-CM

## 2016-10-21 DIAGNOSIS — Z1329 Encounter for screening for other suspected endocrine disorder: Secondary | ICD-10-CM | POA: Diagnosis not present

## 2016-10-21 DIAGNOSIS — N760 Acute vaginitis: Secondary | ICD-10-CM

## 2016-10-21 DIAGNOSIS — Z3042 Encounter for surveillance of injectable contraceptive: Secondary | ICD-10-CM

## 2016-10-21 DIAGNOSIS — E669 Obesity, unspecified: Secondary | ICD-10-CM | POA: Diagnosis not present

## 2016-10-21 DIAGNOSIS — B373 Candidiasis of vulva and vagina: Secondary | ICD-10-CM

## 2016-10-21 DIAGNOSIS — R7989 Other specified abnormal findings of blood chemistry: Secondary | ICD-10-CM

## 2016-10-21 LAB — POCT WET + KOH PREP
TRICH BY WET PREP: ABSENT
YEAST BY KOH: ABSENT
YEAST BY WET PREP: ABSENT

## 2016-10-21 LAB — POCT URINE PREGNANCY: Preg Test, Ur: NEGATIVE

## 2016-10-21 MED ORDER — METRONIDAZOLE 0.75 % VA GEL: 1.0000 | Freq: Every day | VAGINAL | 0 refills | Status: AC

## 2016-10-21 MED ORDER — TRIAMCINOLONE ACETONIDE 0.5 % EX CREA: 1.0000 "application " | TOPICAL_CREAM | Freq: Two times a day (BID) | CUTANEOUS | 0 refills | Status: DC

## 2016-10-21 MED ORDER — MEDROXYPROGESTERONE ACETATE 150 MG/ML IM SUSY
150.0000 mg | PREFILLED_SYRINGE | Freq: Once | INTRAMUSCULAR | Status: AC
  Administered 2016-10-21: 150 mg via INTRAMUSCULAR

## 2016-10-21 NOTE — Progress Notes (Signed)
Lacey Wu  MRN: [id number] DOB: [birth year]  PCP: Mancel Bale, PA-C  Subjective:  Pt presents to clinic for a CPE.  She also needs a pap smear.  She is doing well.  Pt on depo provera for birth control - due for her injection a month ago but missed it - she is sexually active but has used condoms every time since missed window - last sex was 5 days ago with a condom - been on it for >5 years  Last dental exam: not in 2 years Last vision exam: wears glasses - 2 years ago Last pap: 2014  Vaccinations - UTD   Typical meals for patient: 1-2 meals, snacks - some healthy and some fast food Typical beverage choices: 3-4 soft drink, 3- 4 bottles a days Exercises: no exercise - standing job Sleeps: sleeping well - shift work - will many times wake up in the middle of the night and be up for several hours but can then go back to sleep hrs per night  Patient Active Problem List   Diagnosis Date Noted  . Obesity, Class I, BMI 30-34.9 10/21/2016  . Thyromegaly   . Tobacco user 10/19/2011    Review of Systems  Constitutional: Negative.   HENT: Negative.   Eyes: Negative.   Respiratory: Negative.   Cardiovascular: Negative.   Gastrointestinal: Positive for diarrhea (over the last 2 days - using pepto).  Endocrine: Negative.   Genitourinary: Negative.   Musculoskeletal: Negative.   Skin: Positive for rash (itches).  Allergic/Immunologic: Negative.   Neurological: Negative.   Hematological: Negative.   Psychiatric/Behavioral: Negative.      Current Outpatient Prescriptions on File Prior to Visit  Medication Sig Dispense Refill  . medroxyPROGESTERone (DEPO-PROVERA) 150 MG/ML injection Inject 1 mL (150 mg total) into the muscle every 3 (three) months. 1 mL 3   No current facility-administered medications on file prior to visit.     No Known Allergies  Social History   Social History  . Marital status: Single    Spouse name: n/a  . Number of children: 0  . Years of  education: 12th grade   Occupational History  . PRODUCTION Personnel  [employer]   Social History Main Topics  . Smoking status: Current Every Day Smoker    Packs/day: 0.50    Years: 10.00    Types: Cigarettes  . Smokeless tobacco: Never Used     Comment: Thinking about quitting; smokes after eating  . Alcohol use 0.0 oz/week     Comment: 1 drink a week  . Drug use: No  . Sexual activity: Yes    Partners: Male    Birth control/ protection: Injection   Other Topics Concern  . None   Social History Narrative   Lives alone.    Mother lives in [place].     Her brother lives in [place].      Seatbelt - 100%   Guns in home - no    No past surgical history on file.  Family History  Problem Relation Age of Onset  . Asthma Mother   . Diabetes Mother   . High Cholesterol Mother   . Asthma Father   . Hypertension Father   . High Cholesterol Father   . Hyperlipidemia Brother   . Asthma Brother   . Diabetes Maternal Grandmother   . Diabetes Paternal Grandmother   . Hyperlipidemia Paternal Grandmother      Objective:  BP 114/71 (BP Location: Right Arm,  Patient Position: Sitting, Cuff Size: Normal)   Pulse 81   Temp 98.7 F (37.1 C) (Oral)   Resp 16   Ht _0  (1.6 m)   Wt 173 lb 9.6 oz (78.7 kg)   LMP 09/07/2016   SpO2 98%   BMI 30.75 kg/m   Physical Exam  Constitutional: She is oriented to person, place, and time and well-developed, well-nourished, and in no distress.  HENT:  Head: Normocephalic and atraumatic.  Right Ear: Hearing, tympanic membrane, external ear and ear canal normal.  Left Ear: Hearing, tympanic membrane, external ear and ear canal normal.  Nose: Nose normal.  Mouth/Throat: Uvula is midline, oropharynx is clear and moist and mucous membranes are normal.  Eyes: Conjunctivae and EOM are normal. Pupils are equal, round, and reactive to light.  Neck: Trachea normal and normal range of motion. Neck supple. No thyroid mass and no  thyromegaly present.  Cardiovascular: Normal rate, regular rhythm and normal heart sounds.   No murmur heard. Pulmonary/Chest: Effort normal and breath sounds normal. She has no wheezes. Right breast exhibits no inverted nipple, no mass, no nipple discharge, no skin change and no tenderness. Left breast exhibits no inverted nipple, no mass, no nipple discharge, no skin change and no tenderness. Breasts are symmetrical.  Abdominal: Soft. Bowel sounds are normal. There is no tenderness.  Genitourinary: Uterus normal, cervix normal, right adnexa normal, left adnexa normal and vulva normal. Thick  odorless  white and vaginal discharge found.  Musculoskeletal: Normal range of motion.  Lymphadenopathy:    She has no cervical adenopathy.  Neurological: She is alert and oriented to person, place, and time. She has normal motor skills, normal sensation, normal strength and normal reflexes. Gait normal.  Skin: Skin is warm and dry.  Erythematous papules deltoid region without surrounding erythema   Psychiatric: Mood, memory, affect and judgment normal.    Wt Readings from Last 3 Encounters:  10/21/16 173 lb 9.6 oz (78.7 kg)  12/17/15 169 lb (76.7 kg)  08/30/15 167 lb (75.8 kg)     Visual Acuity Screening   Right eye Left eye Both eyes  Without correction:     With correction: _1    Assessment and Plan :  Annual physical exam - Plan: Visual acuity screening  Screening for metabolic disorder - Plan: CMP14+EGFR  Screening for deficiency anemia - Plan: CBC with Differential/Platelet  Screening, lipid - Plan: Lipid panel  Screening for thyroid disorder - Plan: TSH  Encounter for gynecological examination without abnormal finding - Plan: Pap IG, CT/NG NAA, and HPV (high risk)  Depo-Provera contraceptive status - Plan: POCT urine pregnancy, MedroxyPROGESTERone Acetate SUSY 150 mg - d/w pt different form of LARC birth control method she would like to change to Nexplanon - will  plan to have it placed if insurance will cover it at next depo window  Vaginal discharge - Plan: POCT Wet + KOH Prep, RPR  Screen for STD (sexually transmitted disease) - Plan: RPR  Screening for HIV (human immunodeficiency virus) - Plan: HIV antibody (with reflex)  Dermatitis - Plan: triamcinolone cream (KENALOG) 0.5 % - ? seborrhea piliaris  Obesity, Class I, BMI 30-34.9 - d/w pt to decrease soda intake and try to increase exercise - pick healthy snack choices  BV (bacterial vaginosis) - Plan: metroNIDAZOLE (METROGEL VAGINAL) 0.75 % vaginal gel - asymptomatic but will treat  Windell Hummingbird PA-C  [facility] 10/21/2016 5:23 PM

## 2016-10-21 NOTE — Progress Notes (Signed)
Lacey Wu 

## 2016-10-21 NOTE — Patient Instructions (Addendum)
I will contact you with your lab results as soon as they are available.   If you have not heard from me in 2 weeks, please contact me.  The fastest way to get your results is to register for My Chart (see the instructions on the last page of this printout).   IF you received an x-ray today, you will receive an invoice from [facility]. Please contact [facility] at [phone] with questions or concerns regarding your invoice.   IF you received labwork today, you will receive an invoice from [facility]. Please contact [facility] at [phone] with questions or concerns regarding your invoice.   Our billing staff will not be able to assist you with questions regarding bills from these companies.  You will be contacted with the lab results as soon as they are available. The fastest way to get your results is to activate your My Chart account. Instructions are located on the last page of this paperwork. If you have not heard from us regarding the results in 2 weeks, please contact this office.    Etonogestrel implant What is this medicine? ETONOGESTREL (et oh noe JES trel) is a contraceptive (birth control) device. It is used to prevent pregnancy. It can be used for up to 3 years. This medicine may be used for other purposes; ask your health care provider or pharmacist if you have questions. COMMON BRAND NAME(S): Implanon, Nexplanon What should I tell my health care provider before I take this medicine? They need to know if you have any of these conditions: -abnormal vaginal bleeding -blood vessel disease or blood clots -cancer of the breast, cervix, or liver -depression -diabetes -gallbladder disease -headaches -heart disease or recent heart attack -high blood pressure -high cholesterol -kidney disease -liver disease -renal disease -seizures -tobacco smoker -an unusual or allergic reaction to etonogestrel, other hormones, anesthetics or antiseptics, medicines,  foods, dyes, or preservatives -pregnant or trying to get pregnant -breast-feeding How should I use this medicine? This device is inserted just under the skin on the inner side of your upper arm by a health care professional. Talk to your pediatrician regarding the use of this medicine in children. Special care may be needed. Overdosage: If you think you have taken too much of this medicine contact a poison control center or emergency room at once. NOTE: This medicine is only for you. Do not share this medicine with others. What if I miss a dose? This does not apply. What may interact with this medicine? Do not take this medicine with any of the following medications: -amprenavir -bosentan -fosamprenavir This medicine may also interact with the following medications: -barbiturate medicines for inducing sleep or treating seizures -certain medicines for fungal infections like ketoconazole and itraconazole -grapefruit juice -griseofulvin -medicines to treat seizures like carbamazepine, felbamate, oxcarbazepine, phenytoin, topiramate -modafinil -phenylbutazone -rifampin -rufinamide -some medicines to treat HIV infection like atazanavir, indinavir, lopinavir, nelfinavir, tipranavir, ritonavir -St. John's wort This list may not describe all possible interactions. Give your health care provider a list of all the medicines, herbs, non-prescription drugs, or dietary supplements you use. Also tell them if you smoke, drink alcohol, or use illegal drugs. Some items may interact with your medicine. What should I watch for while using this medicine? This product does not protect you against HIV infection (AIDS) or other sexually transmitted diseases. You should be able to feel the implant by pressing your fingertips over the skin where it was inserted. Contact your doctor if you cannot feel the  implant, and use a non-hormonal birth control method (such as condoms) until your doctor confirms that the  implant is in place. If you feel that the implant may have broken or become bent while in your arm, contact your healthcare provider. What side effects may I notice from receiving this medicine? Side effects that you should report to your doctor or health care professional as soon as possible: -allergic reactions like skin rash, itching or hives, swelling of the face, lips, or tongue -breast lumps -changes in emotions or moods -depressed mood -heavy or prolonged menstrual bleeding -pain, irritation, swelling, or bruising at the insertion site -scar at site of insertion -signs of infection at the insertion site such as fever, and skin redness, pain or discharge -signs of pregnancy -signs and symptoms of a blood clot such as breathing problems; changes in vision; chest pain; severe, sudden headache; pain, swelling, warmth in the leg; trouble speaking; sudden numbness or weakness of the face, arm or leg -signs and symptoms of liver injury like dark yellow or brown urine; general ill feeling or flu-like symptoms; light-colored stools; loss of appetite; nausea; right upper belly pain; unusually weak or tired; yellowing of the eyes or skin -unusual vaginal bleeding, discharge -signs and symptoms of a stroke like changes in vision; confusion; trouble speaking or understanding; severe headaches; sudden numbness or weakness of the face, arm or leg; trouble walking; dizziness; loss of balance or coordination Side effects that usually do not require medical attention (report to your doctor or health care professional if they continue or are bothersome): -acne -back pain -breast pain -changes in weight -dizziness -general ill feeling or flu-like symptoms -headache -irregular menstrual bleeding -nausea -sore throat -vaginal irritation or inflammation This list may not describe all possible side effects. Call your doctor for medical advice about side effects. You may report side effects to FDA at  1-800-FDA-1088. Where should I keep my medicine? This drug is given in a hospital or clinic and will not be stored at home. NOTE: This sheet is a summary. It may not cover all possible information. If you have questions about this medicine, talk to your doctor, pharmacist, or health care provider.  2018 Elsevier/Gold Standard (2015-12-11 11:19:22)

## 2016-10-22 LAB — CMP14+EGFR
A/G RATIO: 1.4 (ref 1.2–2.2)
ALBUMIN: 3.9 g/dL (ref 3.5–5.5)
ALK PHOS: 67 IU/L (ref 39–117)
ALT: 13 IU/L (ref 0–32)
AST: 17 IU/L (ref 0–40)
BUN / CREAT RATIO: 11 (ref 9–23)
BUN: 8 mg/dL (ref 6–20)
CO2: 23 mmol/L (ref 18–29)
Calcium: 9.4 mg/dL (ref 8.7–10.2)
Chloride: 104 mmol/L (ref 96–106)
Creatinine, Ser: 0.73 mg/dL (ref 0.57–1.00)
GFR, EST AFRICAN AMERICAN: 124 mL/min/{1.73_m2} (ref >59)
GFR, EST NON AFRICAN AMERICAN: 108 mL/min/{1.73_m2} (ref >59)
GLOBULIN, TOTAL: 2.8 g/dL (ref 1.5–4.5)
GLUCOSE: 92 mg/dL (ref 65–99)
POTASSIUM: 4 mmol/L (ref 3.5–5.2)
Sodium: 142 mmol/L (ref 134–144)
TOTAL PROTEIN: 6.7 g/dL (ref 6.0–8.5)

## 2016-10-22 LAB — CBC WITH DIFFERENTIAL/PLATELET
BASOS: 0 %
Basophils Absolute: 0 10*3/uL (ref 0.0–0.2)
EOS (ABSOLUTE): 0.1 10*3/uL (ref 0.0–0.4)
Eos: 1 %
HEMOGLOBIN: 13.4 g/dL (ref 11.1–15.9)
Hematocrit: 40.5 % (ref 34.0–46.6)
IMMATURE GRANS (ABS): 0 10*3/uL (ref 0.0–0.1)
Immature Granulocytes: 0 %
LYMPHS ABS: 2.5 10*3/uL (ref 0.7–3.1)
LYMPHS: 37 %
MCH: 31.5 pg (ref 26.6–33.0)
MCHC: 33.1 g/dL (ref 31.5–35.7)
MCV: 95 fL (ref 79–97)
MONOCYTES: 6 %
Monocytes Absolute: 0.4 10*3/uL (ref 0.1–0.9)
NEUTROS ABS: 3.8 10*3/uL (ref 1.4–7.0)
Neutrophils: 56 %
Platelets: 261 10*3/uL (ref 150–379)
RBC: 4.26 x10E6/uL (ref 3.77–5.28)
RDW: 13.2 % (ref 12.3–15.4)
WBC: 6.8 10*3/uL (ref 3.4–10.8)

## 2016-10-22 LAB — HIV ANTIBODY (ROUTINE TESTING W REFLEX): HIV SCREEN 4TH GENERATION: NONREACTIVE

## 2016-10-22 LAB — RPR: RPR Ser Ql: NONREACTIVE

## 2016-10-22 LAB — LIPID PANEL
Chol/HDL Ratio: 4 ratio (ref 0.0–4.4)
Cholesterol, Total: 155 mg/dL (ref 100–199)
HDL: 39 mg/dL — ABNORMAL LOW (ref >39)
LDL CALC: 101 mg/dL — AB (ref 0–99)
Triglycerides: 76 mg/dL (ref 0–149)
VLDL CHOLESTEROL CAL: 15 mg/dL (ref 5–40)

## 2016-10-22 LAB — TSH: TSH: 0.288 u[IU]/mL — AB (ref 0.450–4.500)

## 2016-10-26 LAB — PAP IG, CT-NG NAA, HPV HIGH-RISK
CHLAMYDIA, NUC. ACID AMP: NEGATIVE
Gonococcus by Nucleic Acid Amp: NEGATIVE
HPV, high-risk: POSITIVE — AB
PAP Smear Comment: 0

## 2016-10-26 MED ORDER — FLUCONAZOLE 150 MG PO TABS: 150.0000 mg | ORAL_TABLET | Freq: Once | ORAL | 0 refills | Status: AC

## 2016-10-26 NOTE — Addendum Note (Signed)
Addended by: Morrell RiddleWEBER, Geniya Fulgham L on: 10/26/2016 05:01 PM   Modules accepted: Orders

## 2016-10-26 NOTE — Addendum Note (Signed)
Addended by: Morrell RiddleWEBER, SARAH L on: 10/26/2016 07:02 PM   Modules accepted: Orders

## 2016-11-02 ENCOUNTER — Telehealth: Payer: Self-pay | Admitting: Obstetrics and Gynecology

## 2016-11-02 NOTE — Telephone Encounter (Signed)
Called and left a message for patient to call back to schedule a new patient doctor referral. °

## 2016-11-04 ENCOUNTER — Ambulatory Visit: Payer: PRIVATE HEALTH INSURANCE | Admitting: Obstetrics & Gynecology

## 2016-11-17 ENCOUNTER — Ambulatory Visit (INDEPENDENT_AMBULATORY_CARE_PROVIDER_SITE_OTHER): Payer: BLUE CROSS/BLUE SHIELD | Admitting: Obstetrics and Gynecology

## 2016-11-17 ENCOUNTER — Encounter: Payer: Self-pay | Admitting: Obstetrics and Gynecology

## 2016-11-17 ENCOUNTER — Other Ambulatory Visit (HOSPITAL_COMMUNITY)
Admission: RE | Admit: 2016-11-17 | Discharge: 2016-11-17 | Disposition: A | Discharge status: Home / Self Care | Payer: BLUE CROSS/BLUE SHIELD | Source: Ambulatory Visit | Attending: Obstetrics and Gynecology | Admitting: Obstetrics and Gynecology

## 2016-11-17 VITALS — BP 124/70 | HR 72 | Resp 16 | Ht 63.0 in | Wt 171.0 lb

## 2016-11-17 DIAGNOSIS — R87810 Cervical high risk human papillomavirus (HPV) DNA test positive: Secondary | ICD-10-CM | POA: Diagnosis not present

## 2016-11-17 LAB — SPECIMEN STATUS REPORT

## 2016-11-17 LAB — T4, FREE: FREE T4: 1.13 ng/dL (ref 0.82–1.77)

## 2016-11-17 NOTE — Progress Notes (Signed)
GYNECOLOGY  VISIT   HPI: 36 y.o.   Single  African American  female   No obstetric history on file. with Patient's last menstrual period was 09/07/2016.   here for  HPV/abn pap smear. Referred from Benny Lennert, PA-C -- in EPIC  Pap on 10/21/16 - normal and positive HR HPV.  Had yeast on the pap.  Received an Rx for Diflucan but did not take it.  She tested positive for BV at the time as well. Was just treated with Metrogel.   Had an abnormal pap as a teen ager.  Does not remember the details but thinks she had colposcopy.  No other abnormal paps.   Works overnight shift.   GYNECOLOGIC HISTORY: Patient's last menstrual period was 09/07/2016. Contraception:  Depo-Provera injection Menopausal hormone therapy:  N/A Last mammogram:  N/A Last pap smear:   10/21/16 Pap Negative, HR HPV positive        OB History    Gravida Para Term Preterm AB Living   0 0 0 0 0 0   SAB TAB Ectopic Multiple Live Births   0 0 0 0 0         Patient Active Problem List   Diagnosis Date Noted  . Obesity, Class I, BMI 30-34.9 10/21/2016  . Thyromegaly   . Tobacco user 10/19/2011    Past Medical History:  Diagnosis Date  . Thyromegaly     History reviewed. No pertinent surgical history.  Current Outpatient Prescriptions  Medication Sig Dispense Refill  . medroxyPROGESTERone (DEPO-PROVERA) 150 MG/ML injection Inject 1 mL (150 mg total) into the muscle every 3 (three) months. 1 mL 3  . triamcinolone cream (KENALOG) 0.5 % Apply 1 application topically 2 (two) times daily. 45 g 0   No current facility-administered medications for this visit.      ALLERGIES: Patient has no known allergies.  Family History  Problem Relation Age of Onset  . Asthma Mother   . Diabetes Mother   . High Cholesterol Mother   . Asthma Father   . Hypertension Father   . High Cholesterol Father   . Hyperlipidemia Brother   . Asthma Brother   . Diabetes Maternal Grandmother   . Diabetes Paternal Grandmother    . Hyperlipidemia Paternal Grandmother     Social History   Social History  . Marital status: Single    Spouse name: n/a  . Number of children: 0  . Years of education: 12th grade   Occupational History  . PRODUCTION Personnel  [employer]   Social History Main Topics  . Smoking status: Current Every Day Smoker    Packs/day: 0.50    Years: 10.00    Types: Cigarettes  . Smokeless tobacco: Never Used     Comment: Thinking about quitting; smokes after eating  . Alcohol use 0.0 oz/week     Comment: 1 drink a week  . Drug use: No  . Sexual activity: Yes    Partners: Male    Birth control/ protection: Injection     Comment: Depo-Provera   Other Topics Concern  . Not on file   Social History Narrative   Lives alone.    Mother lives in [place].     Her brother lives in [place].      Seatbelt - 100%   Guns in home - no    ROS:  Pertinent items are noted in HPI.  PHYSICAL EXAMINATION:    BP 124/70 (BP Location: Right Arm, Patient Position: Sitting,  Cuff Size: Normal)   Pulse 72   Resp 16   Ht 5\' 3"  (1.6 m)   Wt 171 lb (77.6 kg)   LMP 09/07/2016   BMI 30.29 kg/m     General appearance: alert, cooperative and appears stated age Head: Normocephalic, without obvious abnormality, atraumatic Neck: no adenopathy, supple, symmetrical, trachea midline and thyroid globally enlarged. Lungs: clear to auscultation bilaterally Heart: regular rate and rhythm Abdomen: soft, non-tender, no masses,  no organomegaly Extremities: extremities normal, atraumatic, no cyanosis or edema Skin: Skin color, texture, turgor normal. No rashes or lesions No abnormal inguinal nodes palpated Neurologic: Grossly normal  Pelvic: External genitalia:  no lesions              Urethra:  normal appearing urethra with no masses, tenderness or lesions              Bartholins and Skenes: normal                 Vagina: normal appearing vagina with normal color and discharge, no lesions.   Clumpy discharge.              Cervix: no lesions                Bimanual Exam:  Uterus:  normal size, contour, position, consistency, mobility, non-tender              Adnexa: no mass, fullness, tenderness                 Chaperone was present for exam.  ASSESSMENT  Normal pap Positive HR HPV.  Prior abnormal pap. Smoker.   PLAN  Comprehensive discussion of abnormal paps, HPV, colposcopy, and LEEP procedure.  Discussed smoking cessation as a way to reduce risk of cervical dysplasia and cancer.  Reviewed options for care including: subtyping of HPV for 16/18/45 to determine need for colposcopy, repeat cotesting in one year, or proceeding with colposcopy.  Patient elects for subtyping for 16/18/45 today.    An After Visit Summary was printed and given to the patient.  __30____ minutes face to face time of which over 50% was spent in counseling.

## 2016-11-17 NOTE — Patient Instructions (Signed)
Human Papillomavirus Human papillomavirus (HPV) is the most common sexually transmitted infection (STI). It easily spreads from person to person (is highly contagious). HPV infections cause genital warts. Certain types of HPV may cause cancers, including cancer of the lower part of the uterus (cervix), vagina, outer female genital area (vulva), penis, anus, and rectum. HPV may also cause cancers of the oral cavity, such as the throat, tongue, and tonsils. There are many types of HPV. It usually does not cause symptoms. However, sometimes there are wart-like lesions in the throat or warts in the genital area that you can see or feel. It is possible to be infected for long periods and pass HPV to others without knowing it. What are the causes? HPV is caused by a virus that spreads from person to person through sexual contact. This includes oral, vaginal, or anal sex. What increases the risk? The following factors may make you more likely to develop this condition:  Having unprotected oral, vaginal, or anal sex.  Having several sex partners.  Having a sex partner who has other sex partners.  Having or having had another STI.  Having a weak disease-fighting (immune) system.  Having damaged skin in the genital area.  What are the signs or symptoms? Most people who have HPV do not have any symptoms. If symptoms are present, they may include:  Wartlike lesions in the throat (from having oral sex).  Warts on the infected skin or mucous membranes.  Genital warts that may itch, burn, bleed, or be painful during sexual intercourse.  How is this diagnosed? If wartlike lesions are present in the throat or if genital warts are present, your health care provider can usually diagnose HPV with a physical exam. Genital warts are easily seen. In females, tests may be used to diagnose HPV, including:  A Pap test. A Pap test takes a sample of cells from your cervix to check for cancer and HPV  infection.  An HPV test. This is similar to a Pap test and involves taking a sample of cells from your cervix.  Using a scope to view the cervix (colposcopy). This may be done if a pelvic exam or Pap test is abnormal. A sample of tissue may be removed for testing (biopsy) during the colposcopy.  Currently, there is no test to detect HPV in males. How is this treated? There is no treatment for the virus itself. However, there are treatments for the health problems and symptoms HPV can cause. Your health care provider will monitor you closely after you are treated as HPV can come back and may need treatment again. Treatment for HPV may include:  Medicines, which may be injected or applied to genital warts in a cream, lotion, liquid or gel form.  Use of a probe to apply extreme cold (cryotherapy) to the genital warts.  Application of an intense beam of light (laser treatment) on the genital warts.  Use of a probe to apply extreme heat (electrocautery) on the genital warts.  Surgery to remove the genital warts.  Follow these instructions at home: Medicines  Take over-the-counter and prescription medicines only as told by your health care provider. This include creams for itching or irritation.  Do not treat genital warts with medicines used for treating hand warts. General instructions  Do not touch or scratch the warts.  Do not have sex while you are being treated.  Do not douche or use tampons during treatment (women).  Tell your sex partner about your infection.  He or she may also need to be treated.  If you become pregnant, tell your health care provider that you have HPV. Your health care provider will monitor you closely during pregnancy to make sure your baby is safe.  Keep all follow-up visits as told by your health care provider. This is important. How is this prevented?  Talk with your health care provider about getting the HPV vaccines. These vaccines prevent some HPV  infections and cancers. The vaccines are recommended for males and females between the ages of 9 and 26. They will not work if you already have HPV, and they are not recommended for pregnant women.  After treatment, use condoms during sex to prevent future infections.  Have only one sex partner.  Have a sex partner who does not have other sex partners.  Get regular Pap tests as directed by your health care provider. Contact a health care provider if:  The treated skin becomes red, swollen, or painful.  You have a fever.  You feel generally ill.  You feel lumps or pimples sticking out in and around your genital area.  You develop bleeding of the vagina or the treatment area.  You have painful sexual intercourse. Summary  Human papillomavirus (HPV) is the most common sexually transmitted infection (STI) and is highly contagious.  Most people carrying HPV do not have any symptoms.  HPV can be prevented with vaccination. The vaccine is recommended for males and females between the ages of 9 and 26.  There is no treatment for the virus itself. However, there are treatments for the health problems and symptoms HPV can cause. This information is not intended to replace advice given to you by your health care provider. Make sure you discuss any questions you have with your health care provider. Document Released: 08/14/2003 Document Revised: 05/02/2016 Document Reviewed: 05/02/2016 Elsevier Interactive Patient Education  2017 Elsevier Inc.  

## 2016-11-23 LAB — CERVICOVAGINAL ANCILLARY ONLY: HPV 16/18/45 GENOTYPING: NEGATIVE

## 2016-12-01 ENCOUNTER — Telehealth: Payer: Self-pay | Admitting: *Deleted

## 2016-12-01 NOTE — Telephone Encounter (Signed)
Left message to call Lacey Wu at [phone].  

## 2016-12-01 NOTE — Telephone Encounter (Signed)
-----   Message from Patton SallesBrook E Amundson C Silva, MD sent at 11/27/2016  3:43 PM EDT ----- Please inform patient that her subtyping of HR HPV was negative for types 16,18, and 45.  The lab that processed her subtyping is different from the lab that did her pap.  She will need pap and HR HPR testing in one year.  Please confirm which office will be doing the pap and HR HPV testing for her - her PCP or our office? If she is returning here, place her in an 08 recall.

## 2016-12-13 NOTE — Telephone Encounter (Signed)
Left message, advised calling with lab results, please return call to [phone].

## 2016-12-23 NOTE — Telephone Encounter (Signed)
Advised as seen below. Patient states plans to do pap&hpv with Dr. Edward JollySilva, scheduled for AEX 10/24/17 at 9am. Patient verbalizes understanding and is agreeable.  08 recall placed.  Patient is agreeable to disposition. Will close encounter.

## 2017-01-25 ENCOUNTER — Ambulatory Visit: Payer: PRIVATE HEALTH INSURANCE | Admitting: Physician Assistant

## 2017-06-07 DIAGNOSIS — A599 Trichomoniasis, unspecified: Secondary | ICD-10-CM

## 2017-06-07 HISTORY — DX: Trichomoniasis, unspecified: A59.9

## 2017-07-11 IMAGING — CR DG NECK SOFT TISSUE
2 series · 2 of 2 positions shown · non-contrast
Comparison: None.

CLINICAL DATA: Trouble swallowing

EXAM:
NECK SOFT TISSUES - 1+ VIEW

[AP]
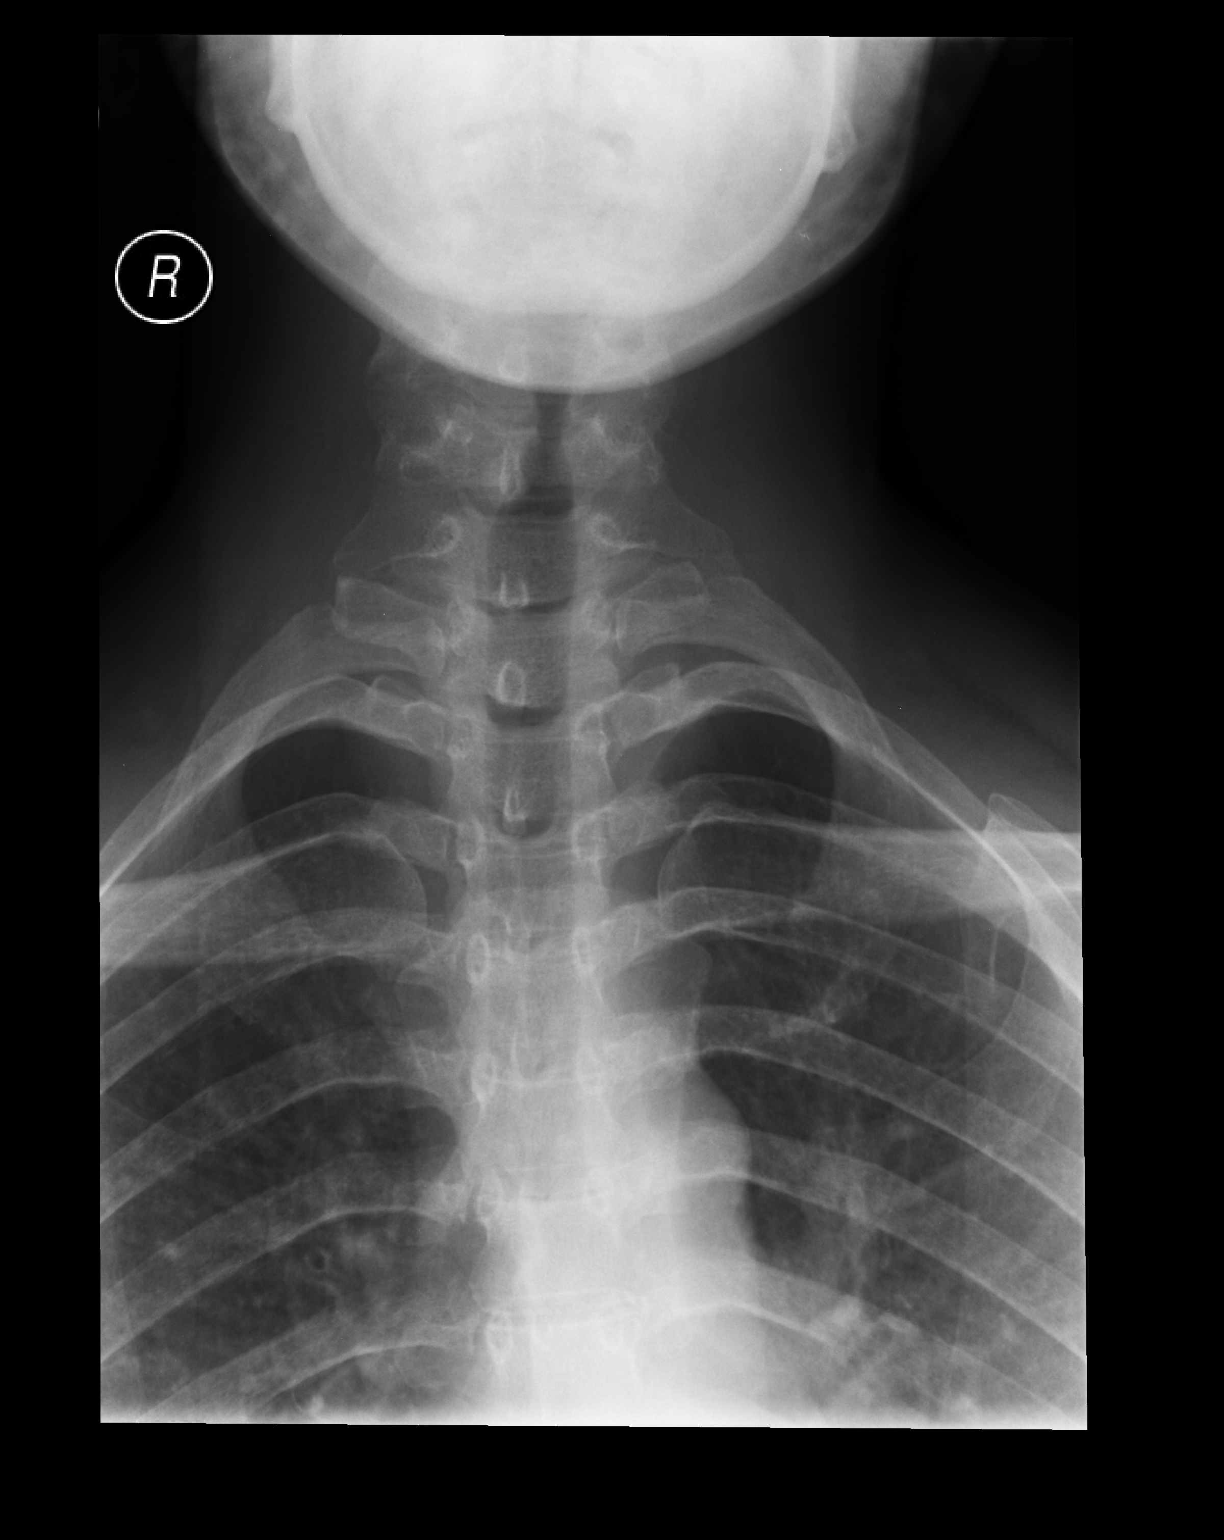

[lateral]
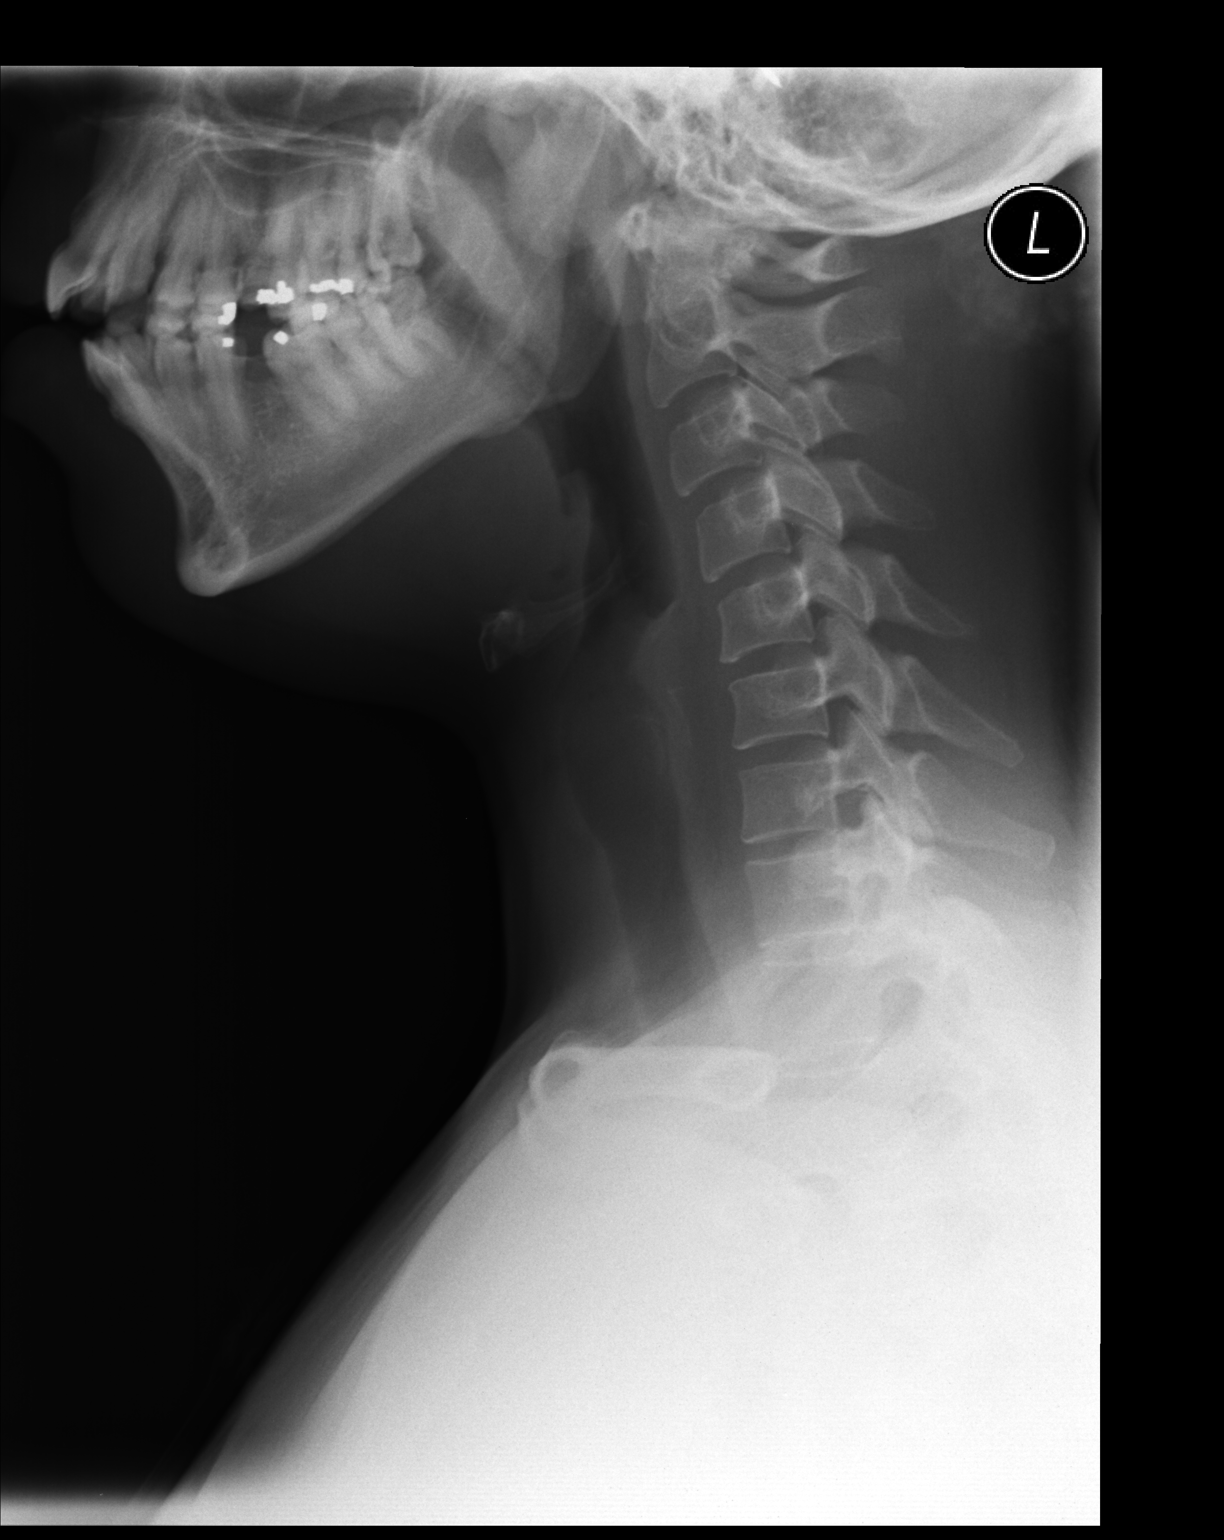

[2 of 2 positions shown; findings below may reference images not displayed]

FINDINGS: There is no evidence of retropharyngeal soft tissue swelling or
epiglottic enlargement. The cervical airway is unremarkable and no
radio-opaque foreign body identified.
IMPRESSION: Negative.

## 2017-10-24 ENCOUNTER — Ambulatory Visit: Payer: BLUE CROSS/BLUE SHIELD | Admitting: Obstetrics and Gynecology

## 2017-10-24 ENCOUNTER — Other Ambulatory Visit (HOSPITAL_COMMUNITY)
Admission: RE | Admit: 2017-10-24 | Discharge: 2017-10-24 | Disposition: A | Discharge status: Home / Self Care | Payer: BLUE CROSS/BLUE SHIELD | Source: Ambulatory Visit | Attending: Obstetrics and Gynecology | Admitting: Obstetrics and Gynecology

## 2017-10-24 ENCOUNTER — Encounter: Payer: Self-pay | Admitting: Obstetrics and Gynecology

## 2017-10-24 ENCOUNTER — Other Ambulatory Visit: Payer: Self-pay

## 2017-10-24 VITALS — BP 134/68 | HR 64 | Resp 16 | Ht 62.75 in | Wt 167.0 lb

## 2017-10-24 DIAGNOSIS — Z01419 Encounter for gynecological examination (general) (routine) without abnormal findings: Secondary | ICD-10-CM

## 2017-10-24 DIAGNOSIS — A5901 Trichomonal vulvovaginitis: Secondary | ICD-10-CM | POA: Insufficient documentation

## 2017-10-24 DIAGNOSIS — Z113 Encounter for screening for infections with a predominantly sexual mode of transmission: Secondary | ICD-10-CM

## 2017-10-24 NOTE — Progress Notes (Signed)
37 y.o. G0P0000 Single African American female here for annual exam.    Having skin lumps on her arms.  Seeing dermatology.   Menses are regular.  Off Depo Provera.   Has difficulty swallowing.   Going on a [place] cruise.   PCP: No PCP  Benny Lennert in past at Urgent Care.  Patient's last menstrual period was 10/14/2017.           Sexually active: Yes.    The current method of family planning is none.   Using condoms.  Exercising: No.  The patient does not participate in regular exercise at present. Smoker:  yes  Health Maintenance: Pap:  10/21/16 Pap Negative, HR HPV positive; 11/17/16 HR HPV was negative for types 16,18, and 45. History of abnormal Pap:  yes TDaP:  10/19/11 Gardasil:   no HIV: 10/21/16 Negative Hep C: never Screening Labs:  Discuss today.  Will do with a new PCP.    reports that she has been smoking cigarettes.  She has a 5.00 pack-year smoking history. She has never used smokeless tobacco. She reports that she drinks alcohol. She reports that she does not use drugs.  Past Medical History:  Diagnosis Date  . Thyromegaly     History reviewed. No pertinent surgical history.  Current Outpatient Medications  Medication Sig Dispense Refill  . betamethasone dipropionate (DIPROLENE) 0.05 % cream   2   No current facility-administered medications for this visit.     Family History  Problem Relation Age of Onset  . Asthma Mother   . Diabetes Mother   . High Cholesterol Mother   . Asthma Father   . Hypertension Father   . High Cholesterol Father   . Hyperlipidemia Brother   . Asthma Brother   . Diabetes Maternal Grandmother   . Diabetes Paternal Grandmother   . Hyperlipidemia Paternal Grandmother     Review of Systems  Constitutional: Negative.   HENT: Negative.   Eyes: Negative.   Respiratory: Negative.   Cardiovascular: Negative.   Gastrointestinal: Negative.   Endocrine: Negative.   Genitourinary: Negative.   Musculoskeletal: Negative.    Skin: Negative.   Allergic/Immunologic: Negative.   Neurological: Negative.   Hematological: Negative.   Psychiatric/Behavioral: Negative.     Exam:   BP 134/68 (BP Location: Right Arm, Patient Position: Sitting, Cuff Size: Large)   Pulse 64   Resp 16   Ht 5' 2.75" (1.594 m)   Wt 167 lb (75.8 kg)   LMP 10/14/2017   BMI 29.82 kg/m     General appearance: alert, cooperative and appears stated age Head: Normocephalic, without obvious abnormality, atraumatic Neck: no adenopathy, supple, symmetrical, trachea midline and thyroid normal to inspection and palpation Lungs: clear to auscultation bilaterally Breasts: normal appearance, no masses or tenderness, No nipple retraction or dimpling, No nipple discharge or bleeding, No axillary or supraclavicular adenopathy Heart: regular rate and rhythm Abdomen: soft, non-tender; no masses, no organomegaly Extremities: extremities normal, atraumatic, no cyanosis or edema Skin: Skin color, texture, turgor normal. No rashes or lesions Lymph nodes: Cervical, supraclavicular, and axillary nodes normal. No abnormal inguinal nodes palpated Neurologic: Grossly normal  Pelvic: External genitalia:  no lesions              Urethra:  normal appearing urethra with no masses, tenderness or lesions              Bartholins and Skenes: normal  Vagina: normal appearing vagina with normal color and discharge, no lesions              Cervix: no lesions              Pap taken: Yes.   Bimanual Exam:  Uterus:  normal size, contour, position, consistency, mobility, non-tender              Adnexa: no mass, fullness, tenderness      Chaperone was present for exam.  Assessment:   Well woman visit with normal exam. Hx positive HR HPV, neg 16/18/45.  Prior abnormal pap.  Smoker.   Plan: Mammogram screening. Recommended self breast awareness. Pap and HR HPV done.  If this pap is abnormal or is positive HR HPV, will do colposcopy. Guidelines  for Calcium, Vitamin D, regular exercise program including cardiovascular and weight bearing exercise. Discussed smoking cessation.  Discussed Gardasil vaccine.  List of PCPs to patient.  Follow up annually and prn.   After visit summary provided.

## 2017-10-24 NOTE — Patient Instructions (Signed)
EXERCISE AND DIET:  We recommended that you start or continue a regular exercise program for good health. Regular exercise means any activity that makes your heart beat faster and makes you sweat.  We recommend exercising at least 30 minutes per day at least 3 days a week, preferably 4 or 5.  We also recommend a diet low in fat and sugar.  Inactivity, poor dietary choices and obesity can cause diabetes, heart attack, stroke, and kidney damage, among others.    ALCOHOL AND SMOKING:  Women should limit their alcohol intake to no more than 7 drinks/beers/glasses of wine (combined, not each!) per week. Moderation of alcohol intake to this level decreases your risk of breast cancer and liver damage. And of course, no recreational drugs are part of a healthy lifestyle.  And absolutely no smoking or even second hand smoke. Most people know smoking can cause heart and lung diseases, but did you know it also contributes to weakening of your bones? Aging of your skin?  Yellowing of your teeth and nails?  CALCIUM AND VITAMIN D:  Adequate intake of calcium and Vitamin D are recommended.  The recommendations for exact amounts of these supplements seem to change often, but generally speaking 600 mg of calcium (either carbonate or citrate) and 800 units of Vitamin D per day seems prudent. Certain women may benefit from higher intake of Vitamin D.  If you are among these women, your doctor will have told you during your visit.    PAP SMEARS:  Pap smears, to check for cervical cancer or precancers,  have traditionally been done yearly, although recent scientific advances have shown that most women can have pap smears less often.  However, every woman still should have a physical exam from her gynecologist every year. It will include a breast check, inspection of the vulva and vagina to check for abnormal growths or skin changes, a visual exam of the cervix, and then an exam to evaluate the size and shape of the uterus and  ovaries.  And after 35 years of age, a rectal exam is indicated to check for rectal cancers. We will also provide age appropriate advice regarding health maintenance, like when you should have certain vaccines, screening for sexually transmitted diseases, bone density testing, colonoscopy, mammograms, etc.   MAMMOGRAMS:  All women over 35 years old should have a yearly mammogram. Many facilities now offer a "3D" mammogram, which may cost around $50 extra out of pocket. If possible,  we recommend you accept the option to have the 3D mammogram performed.  It both reduces the number of women who will be called back for extra views which then turn out to be normal, and it is better than the routine mammogram at detecting truly abnormal areas.    COLONOSCOPY:  Colonoscopy to screen for colon cancer is recommended for all women at age 18.  We know, you hate the idea of the prep.  We agree, BUT, having colon cancer and not knowing it is worse!!  Colon cancer so often starts as a polyp that can be seen and removed at colonscopy, which can quite literally save your life!  And if your first colonoscopy is normal and you have no family history of colon cancer, most women don't have to have it again for 10 years.  Once every ten years, you can do something that may end up saving your life, right?  We will be happy to help you get it scheduled when you are ready.  Be sure to check your insurance coverage so you understand how much it will cost.  It may be covered as a preventative service at no cost, but you should check your particular policy.     Human Papillomavirus Quadrivalent Vaccine suspension for injection What is this medicine? HUMAN PAPILLOMAVIRUS VACCINE (HYOO muhn pap uh LOH muh vahy ruhs vak SEEN) is a vaccine. It is used to prevent infections of four types of the human papillomavirus. In women, the vaccine may lower your risk of getting cervical, vaginal, vulvar, or anal cancer and genital warts. In men,  the vaccine may lower your risk of getting genital warts and anal cancer. You cannot get these diseases from the vaccine. This vaccine does not treat these diseases. This medicine may be used for other purposes; ask your health care provider or pharmacist if you have questions. COMMON BRAND NAME(S): Gardasil What should I tell my health care provider before I take this medicine? They need to know if you have any of these conditions: -fever or infection -hemophilia -HIV infection or AIDS -immune system problems -low platelet count -an unusual reaction to Human Papillomavirus Vaccine, yeast, other medicines, foods, dyes, or preservatives -pregnant or trying to get pregnant -breast-feeding How should I use this medicine? This vaccine is for injection in a muscle on your upper arm or thigh. It is given by a health care professional. Dennis Bast will be observed for 15 minutes after each dose. Sometimes, fainting happens after the vaccine is given. You may be asked to sit or lie down during the 15 minutes. Three doses are given. The second dose is given 2 months after the first dose. The last dose is given 4 months after the second dose. A copy of a Vaccine Information Statement will be given before each vaccination. Read this sheet carefully each time. The sheet may change frequently. Talk to your pediatrician regarding the use of this medicine in children. While this drug may be prescribed for children as young as 17 years of age for selected conditions, precautions do apply. Overdosage: If you think you have taken too much of this medicine contact a poison control center or emergency room at once. NOTE: This medicine is only for you. Do not share this medicine with others. What if I miss a dose? All 3 doses of the vaccine should be given within 6 months. Remember to keep appointments for follow-up doses. Your health care provider will tell you when to return for the next vaccine. Ask your health care  professional for advice if you are unable to keep an appointment or miss a scheduled dose. What may interact with this medicine? -other vaccines This list may not describe all possible interactions. Give your health care provider a list of all the medicines, herbs, non-prescription drugs, or dietary supplements you use. Also tell them if you smoke, drink alcohol, or use illegal drugs. Some items may interact with your medicine. What should I watch for while using this medicine? This vaccine may not fully protect everyone. Continue to have regular pelvic exams and cervical or anal cancer screenings as directed by your doctor. The Human Papillomavirus is a sexually transmitted disease. It can be passed by any kind of sexual activity that involves genital contact. The vaccine works best when given before you have any contact with the virus. Many people who have the virus do not have any signs or symptoms. Tell your doctor or health care professional if you have any reaction or unusual symptom after  symptom after getting the vaccine. What side effects may I notice from receiving this medicine? Side effects that you should report to your doctor or health care professional as soon as possible: -allergic reactions like skin rash, itching or hives, swelling of the face, lips, or tongue -breathing problems -feeling faint or lightheaded, falls Side effects that usually do not require medical attention (report to your doctor or health care professional if they continue or are bothersome): -cough -dizziness -fever -headache -nausea -redness, warmth, swelling, pain, or itching at site where injected This list may not describe all possible side effects. Call your doctor for medical advice about side effects. You may report side effects to FDA at 1-800-FDA-1088. Where should I keep my medicine? This drug is given in a hospital or clinic and will not be stored at home. NOTE: This sheet is a summary. It may not cover all  possible information. If you have questions about this medicine, talk to your doctor, pharmacist, or health care provider.  2018 Elsevier/Gold Standard (2013-07-16 13:14:33)  

## 2017-10-25 LAB — CYTOLOGY - PAP
Chlamydia: NEGATIVE
DIAGNOSIS: NEGATIVE
HPV (WINDOPATH): NOT DETECTED
Neisseria Gonorrhea: NEGATIVE

## 2017-10-26 ENCOUNTER — Encounter: Payer: Self-pay | Admitting: Obstetrics and Gynecology

## 2017-10-27 ENCOUNTER — Other Ambulatory Visit: Payer: Self-pay | Admitting: *Deleted

## 2017-10-27 NOTE — Telephone Encounter (Signed)
-----   Message from Patton Salles, MD sent at 10/26/2017 10:27 PM EDT ----- Please inform patient of results of pap showing normal cells and negative HR HPV.  Pap recall - 08 based on prior pap in 2018 showing positive HR HPV.   Her pap did show signs of trichomonas, which is a sexually transmitted infection.  She needs treatment with Flagyl 500 mg, take 4 tablets (2000 mg) once.  Her partner will need similar treatment.  Ok for similar expedited therapy of partner.  ETOH precautions.  Patient and partner need to complete treatment and then wait one week to resume intercourse.  I do recommend she come in to the office for full STD screening - HIV, syphilis, hepatitis B and C.   Please have the lab add gonorrhea and chlamydia testing to her pap specimen.  (At the same time, please let lab know that this trichomonas result did not come through Epic as an abnormal highlighted test result.)  She will need retesting for trichomonas at the beginning on August.  Please schedule this appointment with me.

## 2017-10-27 NOTE — Telephone Encounter (Signed)
Notes recorded by Leda Min, RN on 10/27/2017 at 11:00 AM EDT Unable to reach patient, no answer, mailbox full.

## 2017-10-28 NOTE — Telephone Encounter (Addendum)
Left message to call Noreene Larsson at [phone]. Advised calling to review recent lab results, will need to speak with you regarding results. Office phones go off at 4:30pm.

## 2017-10-28 NOTE — Telephone Encounter (Signed)
Patient returned call. Ok to leave a detailed message on voicemail. °

## 2017-10-28 NOTE — Telephone Encounter (Signed)
Left message to call Cali Cuartas at [phone].  

## 2017-10-28 NOTE — Telephone Encounter (Signed)
Patient returning call from [name]. Stated that her next break at work will be around 3pm. Asked if Lacey Wu could call her back around that time.

## 2017-10-31 ENCOUNTER — Encounter: Payer: Self-pay | Admitting: Obstetrics and Gynecology

## 2017-10-31 NOTE — Addendum Note (Signed)
Addended by: Ardell Isaacs, Debbe Bales E on: 10/31/2017 07:36 PM   Modules accepted: Orders

## 2017-11-01 NOTE — Telephone Encounter (Signed)
Notes recorded by Leda Min, RN on 11/01/2017 at 2:52 PM EDT Left message to call Noreene Larsson, RN at [phone] to review recent lab results.   ------  Notes recorded by Patton Salles, MD on 10/31/2017 at 7:36 PM EDT Please try to contact patient again regarding her trichomonas infection which needs treatment. She tested negative for gonorrhea and chlamydia.   I do recommend she come in for a lab visit for testing for HIV, syphilis, and hep B and C.  I will place these future orders.   Please place her pap recall 08 and make a future appointment with me for beginning of August for a recheck.  If you are unable to reach her by phone, please send a letter regarding her trichomonas infection which needs treatment, etc.

## 2017-11-02 MED ORDER — METRONIDAZOLE 500 MG PO TABS: 2000.0000 mg | ORAL_TABLET | Freq: Once | ORAL | 0 refills | Status: AC

## 2017-11-02 NOTE — Telephone Encounter (Signed)
Spoke with patient, advised of all results and recommendations as seen below per Dr. Edward Jolly. OV scheduled for 8/5 at 9:30am with Dr. Edward Jolly for [facility] and lab appt on 6/4 at 9:45 am for STD panel.   Rx for Flagyl to verified pharmacy, ETOH precautions reviewed.   Patient will return call to office to advise if EPT is needed. Advised patient will need to provide partners name, DOB and allergies. This is a written RX that will need to be picked up from office.   Patient verbalizes understanding.   08 recall placed.   Will await return call for EPT.

## 2017-11-02 NOTE — Telephone Encounter (Signed)
Spoke with patient. Name, DOB and allergy info provider for EPT. Patients partner will pick up written Rx from office on 11/03/17.   Written RX to Dr. Edward Jolly for signature.   Routing to provider for final review. Patient is agreeable to disposition. Will close encounter.

## 2017-11-08 ENCOUNTER — Other Ambulatory Visit (INDEPENDENT_AMBULATORY_CARE_PROVIDER_SITE_OTHER): Payer: BLUE CROSS/BLUE SHIELD

## 2017-11-08 DIAGNOSIS — Z113 Encounter for screening for infections with a predominantly sexual mode of transmission: Secondary | ICD-10-CM

## 2017-11-09 LAB — HEPATITIS C ANTIBODY

## 2017-11-09 LAB — HEP, RPR, HIV PANEL
HEP B S AG: NEGATIVE
HIV SCREEN 4TH GENERATION: NONREACTIVE
RPR Ser Ql: NONREACTIVE

## 2018-01-09 ENCOUNTER — Ambulatory Visit: Payer: BLUE CROSS/BLUE SHIELD | Admitting: Obstetrics and Gynecology

## 2018-01-11 NOTE — Progress Notes (Signed)
GYNECOLOGY  VISIT   HPI: 37 y.o.   Single  African American  female   G0P0000 with No LMP recorded.   here for TOC for trich.   Trich dx with routine pap STD testing.  Did not have any symptoms of this.   Took Flagyl 2 grams in May, 2019.   No other concerns today.   GYNECOLOGIC HISTORY: No LMP recorded. Contraception:  condoms Menopausal hormone therapy:  n/a Last mammogram:  n/a Last pap smear: 10/24/2017 negative.  11/17/16 HR HPV was negative for types 16,18, and 45.        OB History    Gravida  0   Para  0   Term  0   Preterm  0   AB  0   Living  0     SAB  0   TAB  0   Ectopic  0   Multiple  0   Live Births  0              Patient Active Problem List   Diagnosis Date Noted  . Obesity, Class I, BMI 30-34.9 10/21/2016  . Thyromegaly   . Tobacco user 10/19/2011    Past Medical History:  Diagnosis Date  . Thyromegaly   . Trichomonas vaginalis infection 10/2017    No past surgical history on file.  Current Outpatient Medications  Medication Sig Dispense Refill  . betamethasone dipropionate (DIPROLENE) 0.05 % cream   2   No current facility-administered medications for this visit.      ALLERGIES: Patient has no known allergies.  Family History  Problem Relation Age of Onset  . Asthma Mother   . Diabetes Mother   . High Cholesterol Mother   . Asthma Father   . Hypertension Father   . High Cholesterol Father   . Hyperlipidemia Brother   . Asthma Brother   . Diabetes Maternal Grandmother   . Diabetes Paternal Grandmother   . Hyperlipidemia Paternal Grandmother     Social History   Socioeconomic History  . Marital status: Single    Spouse name: n/a  . Number of children: 0  . Years of education: 12th grade  . Highest education level: Not on file  Occupational History  . Occupation: PRODUCTION    Employer: [employer]  Social Needs  . Financial resource strain: Not on file  . Food insecurity:    Worry:  Not on file    Inability: Not on file  . Transportation needs:    Medical: Not on file    Non-medical: Not on file  Tobacco Use  . Smoking status: Current Every Day Smoker    Packs/day: 0.50    Years: 10.00    Pack years: 5.00    Types: Cigarettes  . Smokeless tobacco: Never Used  . Tobacco comment: Thinking about quitting; smokes after eating  Substance and Sexual Activity  . Alcohol use: Yes    Alcohol/week: 0.0 standard drinks    Comment: 1 drink a week  . Drug use: No  . Sexual activity: Yes    Partners: Male    Birth control/protection: None    Comment: Depo-Provera  Lifestyle  . Physical activity:    Days per week: Not on file    Minutes per session: Not on file  . Stress: Not on file  Relationships  . Social connections:    Talks on phone: Not on file    Gets together: Not on file  Attends religious service: Not on file    Active member of club or organization: Not on file    Attends meetings of clubs or organizations: Not on file    Relationship status: Not on file  . Intimate partner violence:    Fear of current or ex partner: Not on file    Emotionally abused: Not on file    Physically abused: Not on file    Forced sexual activity: Not on file  Other Topics Concern  . Not on file  Social History Narrative   Lives alone.    Mother lives in [place].     Her brother lives in [place].      Seatbelt - 100%   Guns in home - no    Review of Systems  Constitutional: Negative.   HENT: Negative.   Eyes: Negative.   Respiratory: Negative.   Cardiovascular: Negative.   Gastrointestinal: Negative.   Endocrine: Negative.   Genitourinary: Negative.   Musculoskeletal: Negative.   Skin: Negative.   Allergic/Immunologic: Negative.   Neurological: Negative.   Hematological: Negative.   Psychiatric/Behavioral: Negative.   All other systems reviewed and are negative.   PHYSICAL EXAMINATION:    BP (!) 142/88   Pulse 78   Resp 16   Ht 5' 2.75" (1.594  m)   Wt 170 lb (77.1 kg)   BMI 30.35 kg/m     General appearance: alert, cooperative and appears stated age   Pelvic: External genitalia:  no lesions              Urethra:  normal appearing urethra with no masses, tenderness or lesions              Bartholins and Skenes: normal                 Vagina: normal appearing vagina with normal color and discharge, no lesions              Cervix: no lesions                Bimanual Exam:  Uterus:  normal size, contour, position, consistency, mobility, non-tender              Adnexa: no mass, fullness, tenderness      Chaperone was present for exam.  ASSESSMENT  Trichomonas infection.  Treated.   PLAN  Trichomonas discussed.  Affirm testing.  Condoms recommended for STD prevention.  FU prn.    An After Visit Summary was printed and given to the patient.  __15____ minutes face to face time of which over 50% was spent in counseling.

## 2018-01-12 ENCOUNTER — Ambulatory Visit: Payer: BLUE CROSS/BLUE SHIELD | Admitting: Obstetrics and Gynecology

## 2018-01-12 ENCOUNTER — Encounter: Payer: Self-pay | Admitting: Obstetrics and Gynecology

## 2018-01-12 ENCOUNTER — Other Ambulatory Visit: Payer: Self-pay

## 2018-01-12 VITALS — BP 142/88 | HR 78 | Resp 16 | Ht 62.75 in | Wt 170.0 lb

## 2018-01-12 DIAGNOSIS — A599 Trichomoniasis, unspecified: Secondary | ICD-10-CM

## 2018-01-12 NOTE — Patient Instructions (Signed)

## 2018-01-13 ENCOUNTER — Telehealth: Payer: Self-pay | Admitting: *Deleted

## 2018-01-13 LAB — VAGINITIS/VAGINOSIS, DNA PROBE
CANDIDA SPECIES: NEGATIVE
GARDNERELLA VAGINALIS: POSITIVE — AB
TRICHOMONAS VAG: NEGATIVE

## 2018-01-13 NOTE — Telephone Encounter (Addendum)
Notes recorded by Leda MinHamm, Duff Pozzi N, RN on 01/13/2018 at 1:39 PM EDT Left message to call Noreene LarssonJill at [phone].

## 2018-01-13 NOTE — Telephone Encounter (Signed)
-----   Message from Patton SallesBrook E Amundson C Silva, MD sent at 01/13/2018 12:55 PM EDT ----- Please inform of Affirm result showing bacterial vaginosis. There is no sign of trichomonas. She may treat with Flagyl 500 mg po bid for 7 days or Metrogel pv at hs for 5 nights.  Please send Rx to pharmacy of choice. ETOH precautions.

## 2018-01-16 NOTE — Telephone Encounter (Signed)
Left message to call Lacey Wu at [phone].  

## 2018-01-24 NOTE — Telephone Encounter (Signed)
Left detailed message advising to return call to [place] [facility] [phone], regarding recent labs.

## 2018-01-24 NOTE — Telephone Encounter (Signed)
MyChart active, MyChart message to patient.

## 2018-01-26 NOTE — Telephone Encounter (Signed)
Letter pended and to Dr. Silva for review.  

## 2018-01-26 NOTE — Telephone Encounter (Signed)
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 No message left.    Routing to Dr. Edward JollySilva to advise.

## 2018-01-26 NOTE — Telephone Encounter (Signed)
Letter mailed. Encounter closed.  

## 2018-01-26 NOTE — Telephone Encounter (Signed)
I would send a letter with all results and ask her to call if she wishes for treatment for the BV. She may treat the bacterial vaginosis if she is symptomatic.

## 2018-01-30 ENCOUNTER — Telehealth: Payer: Self-pay | Admitting: *Deleted

## 2018-01-30 MED ORDER — METRONIDAZOLE 500 MG PO TABS: 500.0000 mg | ORAL_TABLET | Freq: Two times a day (BID) | ORAL | 0 refills | Status: DC

## 2018-01-30 NOTE — Telephone Encounter (Signed)
Spoke with patient, calling for lab results, DOS 01/12/18. Advised as seen below per Dr. Edward JollySilva. Rx for flagyl PO to verified pharmacy. ETOH precautions reviewed. Patient verbalizes understanding and is agreeable.   Notes recorded by Patton SallesAmundson C Silva, Brook E, MD on 01/13/2018 at 12:55 PM EDT Please inform of Affirm result showing bacterial vaginosis. There is no sign of trichomonas. She may treat with Flagyl 500 mg po bid for 7 days or Metrogel pv at hs for 5 nights.  Please send Rx to pharmacy of choice. ETOH precautions.   Routing to provider for final review. Patient is agreeable to disposition. Will close encounter.

## 2018-03-07 NOTE — Progress Notes (Signed)
GYNECOLOGY  VISIT   HPI: 37 y.o.   Single  African American  female   G0P0000 with Patient's last menstrual period was 02/22/2018.   here for infection, urine has odor.     No pain with urination or urgency.  No blood in urine.  No back pain, nausea, vomiting, or fever.   No vaginal discharge or odor.   Had trichomonas infection in May and had negative TOC. Treated for BV in August.   No change in partner.   Wakes up with a sore throat and it does improve during the day but comes and goes.  Smoking again.  Sleeps with a ceiling fan on.   Urine dip - trace WBC, large amount of blood.   GYNECOLOGIC HISTORY: Patient's last menstrual period was 02/22/2018. Contraception:  condoms Menopausal hormone therapy:  none Last mammogram:  n/a Last pap smear:   10/24/2017 negative.  6/13/18HR HPV was negative for types 16,18, and 45.        OB History    Gravida  0   Para  0   Term  0   Preterm  0   AB  0   Living  0     SAB  0   TAB  0   Ectopic  0   Multiple  0   Live Births  0              Patient Active Problem List   Diagnosis Date Noted  . Obesity, Class I, BMI 30-34.9 10/21/2016  . Thyromegaly   . Tobacco user 10/19/2011    Past Medical History:  Diagnosis Date  . Thyromegaly   . Trichomonas vaginalis infection 10/2017    No past surgical history on file.  Current Outpatient Medications  Medication Sig Dispense Refill  . betamethasone dipropionate (DIPROLENE) 0.05 % cream   2   No current facility-administered medications for this visit.      ALLERGIES: Patient has no known allergies.  Family History  Problem Relation Age of Onset  . Asthma Mother   . Diabetes Mother   . High Cholesterol Mother   . Asthma Father   . Hypertension Father   . High Cholesterol Father   . Hyperlipidemia Brother   . Asthma Brother   . Diabetes Maternal Grandmother   . Diabetes Paternal Grandmother   . Hyperlipidemia Paternal Grandmother     Social  History   Socioeconomic History  . Marital status: Single    Spouse name: n/a  . Number of children: 0  . Years of education: 12th grade  . Highest education level: Not on file  Occupational History  . Occupation: PRODUCTION    Employer: [employer]  Social Needs  . Financial resource strain: Not on file  . Food insecurity:    Worry: Not on file    Inability: Not on file  . Transportation needs:    Medical: Not on file    Non-medical: Not on file  Tobacco Use  . Smoking status: Current Every Day Smoker    Packs/day: 0.50    Years: 10.00    Pack years: 5.00    Types: Cigarettes  . Smokeless tobacco: Never Used  . Tobacco comment: Thinking about quitting; smokes after eating  Substance and Sexual Activity  . Alcohol use: Yes    Alcohol/week: 0.0 standard drinks    Comment: 1 drink a week  . Drug use: No  . Sexual activity: Yes    Partners:  Male    Birth control/protection: None    Comment: Depo-Provera  Lifestyle  . Physical activity:    Days per week: Not on file    Minutes per session: Not on file  . Stress: Not on file  Relationships  . Social connections:    Talks on phone: Not on file    Gets together: Not on file    Attends religious service: Not on file    Active member of club or organization: Not on file    Attends meetings of clubs or organizations: Not on file    Relationship status: Not on file  . Intimate partner violence:    Fear of current or ex partner: Not on file    Emotionally abused: Not on file    Physically abused: Not on file    Forced sexual activity: Not on file  Other Topics Concern  . Not on file  Social History Narrative   Lives alone.    Mother lives in [place].     Her brother lives in [place].      Seatbelt - 100%   Guns in home - no    Review of Systems  Constitutional: Negative.   HENT: Negative.   Eyes: Negative.   Respiratory: Negative.   Cardiovascular: Negative.   Gastrointestinal:  Negative.   Endocrine: Negative.   Genitourinary:       Urinary odor  Musculoskeletal: Negative.   Skin: Negative.   Allergic/Immunologic: Negative.   Neurological: Negative.   Hematological: Negative.   Psychiatric/Behavioral: Negative.   All other systems reviewed and are negative.   PHYSICAL EXAMINATION:    BP 134/88   Pulse 68   Temp 98.1 F (36.7 C) (Oral)   Wt 168 lb (76.2 kg)   LMP 02/22/2018   BMI 30.00 kg/m     General appearance: alert, cooperative and appears stated age    Pelvic: External genitalia:  no lesions              Urethra:  normal appearing urethra with no masses, tenderness or lesions              Bartholins and Skenes: normal                 Vagina: normal appearing vagina with normal color and discharge, no lesions              Cervix: no lesions                Bimanual Exam:  Uterus:  normal size, contour, position, consistency, mobility, non-tender              Adnexa: no mass, fullness, tenderness    Chaperone was present for exam.  ASSESSMENT  Urinary odor.   Recent BV.   PLAN  Urine micro and culture.  Bactrim DS po bid x 3 days.  Affirm done.  Hydrate well.  Return in no improvement or worsens in next 48 hours.  Stop smoking.  Use humidifier at night next to bed.  To PCP if throat discomfort persists.   An After Visit Summary was printed and given to the patient.  __15____ minutes face to face time of which over 50% was spent in counseling.

## 2018-03-08 ENCOUNTER — Encounter: Payer: Self-pay | Admitting: Obstetrics and Gynecology

## 2018-03-08 ENCOUNTER — Ambulatory Visit: Payer: BLUE CROSS/BLUE SHIELD | Admitting: Obstetrics and Gynecology

## 2018-03-08 ENCOUNTER — Other Ambulatory Visit: Payer: Self-pay

## 2018-03-08 VITALS — BP 134/88 | HR 68 | Temp 98.1°F | Wt 168.0 lb

## 2018-03-08 DIAGNOSIS — R8290 Unspecified abnormal findings in urine: Secondary | ICD-10-CM

## 2018-03-08 DIAGNOSIS — Z8742 Personal history of other diseases of the female genital tract: Secondary | ICD-10-CM

## 2018-03-08 LAB — POCT URINALYSIS DIPSTICK
GLUCOSE UA: NEGATIVE
pH, UA: 5 (ref 5.0–8.0)

## 2018-03-08 MED ORDER — SULFAMETHOXAZOLE-TRIMETHOPRIM 800-160 MG PO TABS: 1.0000 | ORAL_TABLET | Freq: Two times a day (BID) | ORAL | 0 refills | Status: DC

## 2018-03-08 NOTE — Patient Instructions (Signed)

## 2018-03-08 NOTE — Addendum Note (Signed)
Addended by: Blima Ledger on: 03/08/2018 08:56 AM   Modules accepted: Orders

## 2018-03-08 NOTE — Addendum Note (Signed)
Addended by: Ardell Isaacs, BROOK E on: 03/08/2018 11:34 AM   Modules accepted: Orders

## 2018-03-09 LAB — URINALYSIS, MICROSCOPIC ONLY: Casts: NONE SEEN /lpf

## 2018-03-09 LAB — VAGINITIS/VAGINOSIS, DNA PROBE
CANDIDA SPECIES: NEGATIVE
Gardnerella vaginalis: NEGATIVE
Trichomonas vaginosis: NEGATIVE

## 2018-03-10 LAB — URINE CULTURE

## 2018-08-06 HISTORY — PX: TONSILLECTOMY AND ADENOIDECTOMY: SHX28

## 2018-11-21 ENCOUNTER — Other Ambulatory Visit: Payer: Self-pay

## 2018-11-21 ENCOUNTER — Telehealth: Payer: Self-pay | Admitting: Obstetrics and Gynecology

## 2018-11-21 ENCOUNTER — Ambulatory Visit: Payer: BLUE CROSS/BLUE SHIELD | Admitting: Obstetrics and Gynecology

## 2018-11-21 NOTE — Progress Notes (Deleted)
38 y.o. G0P0000 Single African American female here for annual exam.    PCP:     No LMP recorded.           Sexually active: {yes no:314532}  The current method of family planning is {contraception:315051}.    Exercising: {yes no:314532}  {types:19826} Smoker:  {YES NO:22349}  Health Maintenance: Pap:  *** History of abnormal Pap:  {YES NO:22349} MMG:  *** Colonoscopy:  *** BMD:   ***  Result  *** TDaP:  *** Gardasil:   {YES NO:22349} HIV: Hep C: Screening Labs:  Hb today: ***, Urine today: ***   reports that she has been smoking cigarettes. She has a 5.00 pack-year smoking history. She has never used smokeless tobacco. She reports current alcohol use. She reports that she does not use drugs.  Past Medical History:  Diagnosis Date  . Thyromegaly   . Trichomonas vaginalis infection 10/2017    No past surgical history on file.  Current Outpatient Medications  Medication Sig Dispense Refill  . betamethasone dipropionate (DIPROLENE) 0.05 % cream   2  . sulfamethoxazole-trimethoprim (BACTRIM DS) 800-160 MG tablet Take 1 tablet by mouth 2 (two) times daily. One PO BID x 3 days 6 tablet 0   No current facility-administered medications for this visit.     Family History  Problem Relation Age of Onset  . Asthma Mother   . Diabetes Mother   . High Cholesterol Mother   . Asthma Father   . Hypertension Father   . High Cholesterol Father   . Hyperlipidemia Brother   . Asthma Brother   . Diabetes Maternal Grandmother   . Diabetes Paternal Grandmother   . Hyperlipidemia Paternal Grandmother     Review of Systems  Exam:   There were no vitals taken for this visit.    General appearance: alert, cooperative and appears stated age Head: normocephalic, without obvious abnormality, atraumatic Neck: no adenopathy, supple, symmetrical, trachea midline and thyroid normal to inspection and palpation Lungs: clear to auscultation bilaterally Breasts: normal appearance, no masses  or tenderness, No nipple retraction or dimpling, No nipple discharge or bleeding, No axillary adenopathy Heart: regular rate and rhythm Abdomen: soft, non-tender; no masses, no organomegaly Extremities: extremities normal, atraumatic, no cyanosis or edema Skin: skin color, texture, turgor normal. No rashes or lesions Lymph nodes: cervical, supraclavicular, and axillary nodes normal. Neurologic: grossly normal  Pelvic: External genitalia:  no lesions              No abnormal inguinal nodes palpated.              Urethra:  normal appearing urethra with no masses, tenderness or lesions              Bartholins and Skenes: normal                 Vagina: normal appearing vagina with normal color and discharge, no lesions              Cervix: no lesions              Pap taken: {yes no:314532} Bimanual Exam:  Uterus:  normal size, contour, position, consistency, mobility, non-tender              Adnexa: no mass, fullness, tenderness              Rectal exam: {yes no:314532}.  Confirms.              Anus:  normal sphincter  tone, no lesions  Chaperone was present for exam.  Assessment:   Well woman visit with normal exam.   Plan: Mammogram screening discussed. Self breast awareness reviewed. Pap and HR HPV as above. Guidelines for Calcium, Vitamin D, regular exercise program including cardiovascular and weight bearing exercise.   Follow up annually and prn.   Additional counseling given.  {yes [id number]. _______ minutes face to face time of which over 50% was spent in counseling.    After visit summary provided.

## 2018-11-21 NOTE — Telephone Encounter (Signed)
Patient arrived to aex appointment today and rescheduled due to having issues with new insurance coverage. She decided to reschedule to 12/21/18 .

## 2018-11-22 NOTE — Telephone Encounter (Signed)
Encounter reviewed and closed.  

## 2018-11-24 ENCOUNTER — Ambulatory Visit: Payer: BLUE CROSS/BLUE SHIELD | Admitting: Obstetrics and Gynecology

## 2018-12-21 ENCOUNTER — Ambulatory Visit: Payer: Self-pay | Admitting: Obstetrics and Gynecology

## 2019-03-05 ENCOUNTER — Other Ambulatory Visit: Payer: Self-pay

## 2019-03-05 NOTE — Progress Notes (Deleted)
38 y.o. G0P0000 Single African American female here for annual exam.    PCP:     No LMP recorded.           Sexually active: {yes no:314532}  The current method of family planning is {contraception:315051}.    Exercising: {yes no:314532}  {types:19826} Smoker:  {YES NO:22349}  Health Maintenance: Pap: 10-24-17 Neg:Neg HR HPV, 11-17-16 HPV 16/18/45 Neg, 10-21-16 Neg:Pos HR HPV History of abnormal Pap:  Yes, 10-21-16 Neg:Pos HR HPV, 16/18/45 HR HPV Neg on 11-17-16 MMG:  n/a Colonoscopy:  n/a BMD:   n/a  Result  n/a TDaP:  10-19-11 Gardasil:   no HIV:11-08-17 NR Hep C: 11-08-17 Neg Screening Labs:  Hb today: ***, Urine today: ***   reports that she has been smoking cigarettes. She has a 5.00 pack-year smoking history. She has never used smokeless tobacco. She reports current alcohol use. She reports that she does not use drugs.  Past Medical History:  Diagnosis Date  . Thyromegaly   . Trichomonas vaginalis infection 10/2017    No past surgical history on file.  Current Outpatient Medications  Medication Sig Dispense Refill  . betamethasone dipropionate (DIPROLENE) 0.05 % cream   2  . sulfamethoxazole-trimethoprim (BACTRIM DS) 800-160 MG tablet Take 1 tablet by mouth 2 (two) times daily. One PO BID x 3 days 6 tablet 0   No current facility-administered medications for this visit.     Family History  Problem Relation Age of Onset  . Asthma Mother   . Diabetes Mother   . High Cholesterol Mother   . Asthma Father   . Hypertension Father   . High Cholesterol Father   . Hyperlipidemia Brother   . Asthma Brother   . Diabetes Maternal Grandmother   . Diabetes Paternal Grandmother   . Hyperlipidemia Paternal Grandmother     Review of Systems  Exam:   There were no vitals taken for this visit.    General appearance: alert, cooperative and appears stated age Head: normocephalic, without obvious abnormality, atraumatic Neck: no adenopathy, supple, symmetrical, trachea midline  and thyroid normal to inspection and palpation Lungs: clear to auscultation bilaterally Breasts: normal appearance, no masses or tenderness, No nipple retraction or dimpling, No nipple discharge or bleeding, No axillary adenopathy Heart: regular rate and rhythm Abdomen: soft, non-tender; no masses, no organomegaly Extremities: extremities normal, atraumatic, no cyanosis or edema Skin: skin color, texture, turgor normal. No rashes or lesions Lymph nodes: cervical, supraclavicular, and axillary nodes normal. Neurologic: grossly normal  Pelvic: External genitalia:  no lesions              No abnormal inguinal nodes palpated.              Urethra:  normal appearing urethra with no masses, tenderness or lesions              Bartholins and Skenes: normal                 Vagina: normal appearing vagina with normal color and discharge, no lesions              Cervix: no lesions              Pap taken: {yes no:314532} Bimanual Exam:  Uterus:  normal size, contour, position, consistency, mobility, non-tender              Adnexa: no mass, fullness, tenderness              Rectal exam: {  yes no:314532}.  Confirms.              Anus:  normal sphincter tone, no lesions  Chaperone was present for exam.  Assessment:   Well woman visit with normal exam.   Plan: Mammogram screening discussed. Self breast awareness reviewed. Pap and HR HPV as above. Guidelines for Calcium, Vitamin D, regular exercise program including cardiovascular and weight bearing exercise.   Follow up annually and prn.   Additional counseling given.  {yes [id number]. _______ minutes face to face time of which over 50% was spent in counseling.    After visit summary provided.

## 2019-03-06 ENCOUNTER — Telehealth: Payer: Self-pay | Admitting: Obstetrics and Gynecology

## 2019-03-06 ENCOUNTER — Ambulatory Visit: Payer: Self-pay | Admitting: Obstetrics and Gynecology

## 2019-03-06 NOTE — Telephone Encounter (Signed)
Thank you for the update!

## 2019-03-06 NOTE — Telephone Encounter (Signed)
Patient arrived for today's appointment, did not have information for new insurance. Rescheduled to 03/13/19.

## 2019-03-12 NOTE — Progress Notes (Signed)
38 y.o. G0P0000 Single African American female here for annual exam.   Patient would like to restart Depo Provera injections.  Menses are sometimes occurring twice a month, occurring 2 - 3 times this year.  This happened for the last time in August.  Each time she bleeds for 5 - 6 days.   Usual menstrual cramping.   New sexual partner.   PCP:   None.  Patient's last menstrual period was 03/11/2019 (exact date).           Sexually active: Yes.    The current method of family planning is condoms everytime.    Exercising: No.  The patient does not participate in regular exercise at present. Smoker:  Yes, 1/2 ppd  Health Maintenance: Pap: 10-24-17 Neg:Neg HR HPV,10-21-16 Neg:Pos HR HPV--Neg 16/18/45, 05-17-13 normal pap and negative HR HPV. History of abnormal Pap:  Yes, 10-21-16 Neg:Pos HR HPV--Neg 16/18/45 MMG:  n/a Colonoscopy:  n/a BMD:   n/a  Result  n/a TDaP:  Within last 10 years Gardasil:   no HIV:11-08-17 NR Hep C:11-08-17 Neg Screening Labs:   Today. Flu vaccine:  Completed.   reports that she has been smoking cigarettes. She has a 5.00 pack-year smoking history. She has never used smokeless tobacco. She reports previous alcohol use. She reports that she does not use drugs.  Past Medical History:  Diagnosis Date  . Thyromegaly   . Trichomonas vaginalis infection 10/2017    Past Surgical History:  Procedure Laterality Date  . TONSILLECTOMY AND ADENOIDECTOMY  08/2018    No current outpatient medications on file.   No current facility-administered medications for this visit.     Family History  Problem Relation Age of Onset  . Asthma Mother   . Diabetes Mother   . High Cholesterol Mother   . Asthma Father   . Hypertension Father   . High Cholesterol Father   . Hyperlipidemia Brother   . Asthma Brother   . Diabetes Maternal Grandmother   . Diabetes Paternal Grandmother   . Hyperlipidemia Paternal Grandmother     Review of Systems  All other systems  reviewed and are negative.   Exam:   BP (!) 142/80   Pulse 66   Temp 98.1 F (36.7 C) (Temporal)   Resp 18   Ht 5\' 3"  (1.6 m)   Wt 153 lb (69.4 kg)   LMP 03/11/2019 (Exact Date)   BMI 27.10 kg/m     General appearance: alert, cooperative and appears stated age Head: normocephalic, without obvious abnormality, atraumatic Neck: no adenopathy, supple, symmetrical, trachea midline and thyroid normal to inspection and palpation Lungs: clear to auscultation bilaterally Breasts: normal appearance, no masses or tenderness, No nipple retraction or dimpling, No nipple discharge or bleeding, No axillary adenopathy Heart: regular rate and rhythm Abdomen: soft, non-tender; no masses, no organomegaly Extremities: extremities normal, atraumatic, no cyanosis or edema Skin: skin color, texture, turgor normal. No rashes or lesions Lymph nodes: cervical, supraclavicular, and axillary nodes normal. Neurologic: grossly normal  Pelvic: External genitalia:  no lesions              No abnormal inguinal nodes palpated.              Urethra:  normal appearing urethra with no masses, tenderness or lesions              Bartholins and Skenes: normal                 Vagina: normal appearing  vagina with normal color and discharge, no lesions              Cervix: no lesions              Pap taken: Yes.   Bimanual Exam:  Uterus:  normal size, contour, position, consistency, mobility, non-tender              Adnexa: no mass, fullness, tenderness        Chaperone was present for exam.  Assessment:   Well woman visit with normal exam. Hx positive HR HPV, neg 16/18/45.  Prior abnormal pap.  Smoker.  Not ready to quit.   Plan: Mammogram screening discussed. Self breast awareness reviewed. Pap and HR HPV as above. Guidelines for Calcium, Vitamin D, regular exercise program including cardiovascular and weight bearing exercise. STD screening and routine labs.  Depo Provera 150 gm Im q 3 months for one  year.  She will have a pelvic US if her irregular cycles persist.  Gardasil series.  Follow up annually and prn.       After visit summary provided.

## 2019-03-13 ENCOUNTER — Other Ambulatory Visit: Payer: Self-pay

## 2019-03-13 ENCOUNTER — Encounter: Payer: Self-pay | Admitting: Obstetrics and Gynecology

## 2019-03-13 ENCOUNTER — Other Ambulatory Visit (HOSPITAL_COMMUNITY)
Admission: RE | Admit: 2019-03-13 | Discharge: 2019-03-13 | Disposition: A | Discharge status: Home / Self Care | Payer: BLUE CROSS/BLUE SHIELD | Source: Ambulatory Visit | Attending: Obstetrics and Gynecology | Admitting: Obstetrics and Gynecology

## 2019-03-13 ENCOUNTER — Ambulatory Visit (INDEPENDENT_AMBULATORY_CARE_PROVIDER_SITE_OTHER): Payer: 59 | Admitting: Obstetrics and Gynecology

## 2019-03-13 VITALS — BP 142/80 | HR 66 | Temp 98.1°F | Resp 18 | Ht 63.0 in | Wt 153.0 lb

## 2019-03-13 DIAGNOSIS — Z01419 Encounter for gynecological examination (general) (routine) without abnormal findings: Secondary | ICD-10-CM

## 2019-03-13 DIAGNOSIS — Z23 Encounter for immunization: Secondary | ICD-10-CM | POA: Diagnosis not present

## 2019-03-13 DIAGNOSIS — Z113 Encounter for screening for infections with a predominantly sexual mode of transmission: Secondary | ICD-10-CM | POA: Diagnosis not present

## 2019-03-13 DIAGNOSIS — Z309 Encounter for contraceptive management, unspecified: Secondary | ICD-10-CM | POA: Diagnosis not present

## 2019-03-13 MED ORDER — MEDROXYPROGESTERONE ACETATE 150 MG/ML IM SUSP
150.0000 mg | Freq: Once | INTRAMUSCULAR | Status: AC
  Administered 2019-03-13: 17:00:00 150 mg via INTRAMUSCULAR

## 2019-03-13 NOTE — Patient Instructions (Signed)

## 2019-03-14 ENCOUNTER — Telehealth: Payer: Self-pay | Admitting: Obstetrics and Gynecology

## 2019-03-14 LAB — LIPID PANEL
Chol/HDL Ratio: 3.6 ratio (ref 0.0–4.4)
Cholesterol, Total: 154 mg/dL (ref 100–199)
HDL: 43 mg/dL (ref >39)
LDL Chol Calc (NIH): 97 mg/dL (ref 0–99)
Triglycerides: 72 mg/dL (ref 0–149)
VLDL Cholesterol Cal: 14 mg/dL (ref 5–40)

## 2019-03-14 LAB — COMPREHENSIVE METABOLIC PANEL
ALT: 10 IU/L (ref 0–32)
AST: 17 IU/L (ref 0–40)
Albumin/Globulin Ratio: 1.2 (ref 1.2–2.2)
Albumin: 3.8 g/dL (ref 3.8–4.8)
Alkaline Phosphatase: 68 IU/L (ref 39–117)
BUN/Creatinine Ratio: 15 (ref 9–23)
BUN: 9 mg/dL (ref 6–20)
Bilirubin Total: 0.3 mg/dL (ref 0.0–1.2)
CO2: 22 mmol/L (ref 20–29)
Calcium: 9.1 mg/dL (ref 8.7–10.2)
Chloride: 107 mmol/L — ABNORMAL HIGH (ref 96–106)
Creatinine, Ser: 0.61 mg/dL (ref 0.57–1.00)
GFR calc Af Amer: 135 mL/min/{1.73_m2} (ref >59)
GFR calc non Af Amer: 117 mL/min/{1.73_m2} (ref >59)
Globulin, Total: 3.3 g/dL (ref 1.5–4.5)
Glucose: 83 mg/dL (ref 65–99)
Potassium: 3.9 mmol/L (ref 3.5–5.2)
Sodium: 142 mmol/L (ref 134–144)
Total Protein: 7.1 g/dL (ref 6.0–8.5)

## 2019-03-14 LAB — CBC
Hematocrit: 39.2 % (ref 34.0–46.6)
Hemoglobin: 12.8 g/dL (ref 11.1–15.9)
MCH: 31.4 pg (ref 26.6–33.0)
MCHC: 32.7 g/dL (ref 31.5–35.7)
MCV: 96 fL (ref 79–97)
Platelets: 248 10*3/uL (ref 150–450)
RBC: 4.07 x10E6/uL (ref 3.77–5.28)
RDW: 12.2 % (ref 11.7–15.4)
WBC: 9 10*3/uL (ref 3.4–10.8)

## 2019-03-14 LAB — HEPATITIS C ANTIBODY: Hep C Virus Ab: <0.1 s/co ratio (ref 0.0–0.9)

## 2019-03-14 LAB — HEP, RPR, HIV PANEL
HIV Screen 4th Generation wRfx: NONREACTIVE
Hepatitis B Surface Ag: NEGATIVE
RPR Ser Ql: NONREACTIVE

## 2019-03-14 NOTE — Telephone Encounter (Signed)
Left message for patient to call and schedule her Gardasil due after 05/09/19 and Depo Provera due 05/29/19-06/12/2019.

## 2019-03-15 NOTE — Telephone Encounter (Signed)
Patient returned call. Gardisil scheduled for 05/11/19 and depo injection 05/30/19.

## 2019-03-22 LAB — CYTOLOGY - PAP
Chlamydia: NEGATIVE
Comment: NEGATIVE
Comment: NEGATIVE
Comment: NEGATIVE
Comment: NORMAL
Diagnosis: NEGATIVE
High risk HPV: NEGATIVE
Neisseria Gonorrhea: NEGATIVE
Trichomonas: POSITIVE — AB

## 2019-03-26 ENCOUNTER — Encounter: Payer: Self-pay | Admitting: Obstetrics and Gynecology

## 2019-03-27 ENCOUNTER — Telehealth: Payer: Self-pay | Admitting: *Deleted

## 2019-03-27 NOTE — Telephone Encounter (Signed)
Notes recorded by Burnice Logan, RN on 03/27/2019 at 8:50 AM EDT  Left message to call Sharee Pimple, RN at [facility] [phone].   63 recall placed  ------   Notes recorded by Nunzio Cobbs, MD on 03/26/2019 at 2:15 PM EDT  Please contact patient with results.  Her pap is normal and her HR HPV is negative.  Her next pap is due in 36 months due to prior hx of positive HR HPV.  Please place this recall.   She has trichomonas.  I recommend treating with Flagyl 2 grams.  She can take Flagyl 500 mg, take 4 tablets once.  Disp: 4  RF: None.  She should abstain from intercourse for one week after doing her tx.  I recommend retesting in 1 month.  Please make this appointment.   She may have tx for her partner as well, if she wishes.   Her testing is negative for gonorrhea and chlamydia.   She tested negative for

## 2019-03-29 NOTE — Telephone Encounter (Signed)
Left message to call Forrest Jaroszewski, RN at [facility] [phone].   

## 2019-04-03 NOTE — Telephone Encounter (Signed)
Spoke with patient, unable to talk, will return call on her break. Advised to ask for any available triage nurse.

## 2019-04-04 MED ORDER — METRONIDAZOLE 500 MG PO TABS: 2000.0000 mg | ORAL_TABLET | Freq: Once | ORAL | 0 refills | Status: AC

## 2019-04-04 NOTE — Telephone Encounter (Signed)
Pt returned call. Spoke with pt. Pt given lab results. Pt states understanding. Rx sent. Pt declined EPT at this time. To call back if needed.   Encounter closed.

## 2019-04-06 ENCOUNTER — Telehealth: Payer: Self-pay | Admitting: Obstetrics and Gynecology

## 2019-04-06 NOTE — Telephone Encounter (Signed)
Patient is calling to speak with a nurse regarding expedited partner therapy.

## 2019-04-06 NOTE — Telephone Encounter (Signed)
Spoke with pt. Pt wants EPT for trich. Will leave Rx in envelope at front desk. Pt instructed to refrain from intercourse for 7-10 days until both are treated. Pt to return for 1 month follow up appt per Dr Quincy Simmonds. Scheduled for TOC 04/16/19 at  8:00am. Pt verbalized understanding.    Will route to Dr Sabra Heck.  CC: Dr Quincy Simmonds  Encounter closed.

## 2019-04-12 NOTE — Progress Notes (Signed)
GYNECOLOGY  VISIT   HPI: 38 y.o.   Single  African American  female   G0P0000 with Patient's last menstrual period was 03/13/2019 (exact date).   here for test of cure for trichomonas.  She and her partner treated with Flagyl 2 grams and completed.  She denies vaginal itching, discharge, or burning.   She is on her menses.   GYNECOLOGIC HISTORY: Patient's last menstrual period was 03/13/2019 (exact date). Contraception:  Depo Provera Menopausal hormone therapy:  n/a Last mammogram:  n/a Last pap smear:  03-13-19 Neg:Neg HR HPV,10-24-17 Neg:Neg HR HPV,10-21-16 Neg:Pos HR HPV--Neg 16/18/45, 05-17-13 normal pap and negative HR HPV.        OB History    Gravida  0   Para  0   Term  0   Preterm  0   AB  0   Living  0     SAB  0   TAB  0   Ectopic  0   Multiple  0   Live Births  0              Patient Active Problem List   Diagnosis Date Noted  . Obesity, Class I, BMI 30-34.9 10/21/2016  . Thyromegaly   . Tobacco user 10/19/2011    Past Medical History:  Diagnosis Date  . Thyromegaly   . Trichomonas vaginalis infection 10/2017, 03/2019    Past Surgical History:  Procedure Laterality Date  . TONSILLECTOMY AND ADENOIDECTOMY  08/2018    No current outpatient medications on file.   No current facility-administered medications for this visit.      ALLERGIES: Patient has no known allergies.  Family History  Problem Relation Age of Onset  . Asthma Mother   . Diabetes Mother   . High Cholesterol Mother   . Asthma Father   . Hypertension Father   . High Cholesterol Father   . Hyperlipidemia Brother   . Asthma Brother   . Diabetes Maternal Grandmother   . Diabetes Paternal Grandmother   . Hyperlipidemia Paternal Grandmother     Social History   Socioeconomic History  . Marital status: Single    Spouse name: n/a  . Number of children: 0  . Years of education: 12th grade  . Highest education level: Not on file  Occupational History  .  Occupation: PRODUCTION    Employer: [employer]  Social Needs  . Financial resource strain: Not on file  . Food insecurity    Worry: Not on file    Inability: Not on file  . Transportation needs    Medical: Not on file    Non-medical: Not on file  Tobacco Use  . Smoking status: Current Every Day Smoker    Packs/day: 0.50    Years: 10.00    Pack years: 5.00    Types: Cigarettes  . Smokeless tobacco: Never Used  . Tobacco comment: Thinking about quitting; smokes after eating  Substance and Sexual Activity  . Alcohol use: Not Currently    Alcohol/week: 0.0 standard drinks  . Drug use: No  . Sexual activity: Yes    Partners: Male    Birth control/protection: None    Comment: Depo-Provera  Lifestyle  . Physical activity    Days per week: Not on file    Minutes per session: Not on file  . Stress: Not on file  Relationships  . Social Musician on phone: Not on file    Gets together: Not  on file    Attends religious service: Not on file    Active member of club or organization: Not on file    Attends meetings of clubs or organizations: Not on file    Relationship status: Not on file  . Intimate partner violence    Fear of current or ex partner: Not on file    Emotionally abused: Not on file    Physically abused: Not on file    Forced sexual activity: Not on file  Other Topics Concern  . Not on file  Social History Narrative   Lives alone.    Mother lives in [place].     Her brother lives in [place].      Seatbelt - 100%   Guns in home - no    Review of Systems  All other systems reviewed and are negative.   PHYSICAL EXAMINATION:    BP 134/82   Pulse 70   Temp (!) 97.2 F (36.2 C)   Ht 5\' 3"  (1.6 m)   Wt 153 lb (69.4 kg)   LMP 03/13/2019 (Exact Date)   BMI 27.10 kg/m     General appearance: alert, cooperative and appears stated age   Pelvic: External genitalia:  no lesions              Urethra:  normal appearing urethra  with no masses, tenderness or lesions              Bartholins and Skenes: normal                 Vagina: normal appearing vagina with normal color and discharge, no lesions              Cervix: no lesions.  No menstrual blood noted.                Bimanual Exam:  Uterus:  normal size, contour, position, consistency, mobility, non-tender              Adnexa: no mass, fullness, tenderness          Chaperone was present for exam.  ASSESSMENT  Trichomonas infection.  Status post Flagyl of patient and partner. Hx prior positive HR HPV.  Currently negative.  PLAN  Nuswab sent to [place] for trichomonas testing.   Signs and symptoms of trichomonas discussed including asymptomatic infection.  Condoms recommended. I discussed her 3 year pap protocol also.  FU for depo injection and prn.   An After Visit Summary was printed and given to the patient.  __15____ minutes face to face time of which over 50% was spent in counseling.

## 2019-04-16 ENCOUNTER — Other Ambulatory Visit: Payer: Self-pay

## 2019-04-16 ENCOUNTER — Ambulatory Visit: Payer: 59 | Admitting: Obstetrics and Gynecology

## 2019-04-16 ENCOUNTER — Encounter: Payer: Self-pay | Admitting: Obstetrics and Gynecology

## 2019-04-16 VITALS — BP 134/82 | HR 70 | Temp 97.2°F | Ht 63.0 in | Wt 153.0 lb

## 2019-04-16 DIAGNOSIS — A599 Trichomoniasis, unspecified: Secondary | ICD-10-CM

## 2019-04-16 DIAGNOSIS — Z8619 Personal history of other infectious and parasitic diseases: Secondary | ICD-10-CM | POA: Diagnosis not present

## 2019-04-18 LAB — TRICHOMONAS VAGINALIS, PROBE AMP: Trich vag by NAA: NEGATIVE

## 2019-05-07 NOTE — Progress Notes (Deleted)
Patient in today for 2nd Gardasil injection.   Contraception: Depo LMP: *** Last AEX: 03/13/19 with Dr. Quincy Simmonds  Injection given in Left Deltoid. Patient tolerated shot well.   Patient informed next injection due in about 4 months.  Advised patient, if not on birth control, to return for next injection with cycle.   Routed to provider for final review.  Encounter closed.

## 2019-05-10 ENCOUNTER — Telehealth: Payer: Self-pay | Admitting: Obstetrics and Gynecology

## 2019-05-10 NOTE — Telephone Encounter (Signed)
Patient cancelled 2nd gardisil shot and does not wish to reschedule.

## 2019-05-11 ENCOUNTER — Ambulatory Visit: Payer: 59

## 2019-05-11 NOTE — Telephone Encounter (Signed)
Encounter closed

## 2019-05-11 NOTE — Telephone Encounter (Signed)
Thank you for the update.  Ok to close encounter.  Cc- Triage

## 2019-05-28 ENCOUNTER — Ambulatory Visit: Payer: 59

## 2019-05-30 ENCOUNTER — Ambulatory Visit: Payer: 59

## 2019-06-04 ENCOUNTER — Other Ambulatory Visit: Payer: Self-pay

## 2019-06-04 ENCOUNTER — Ambulatory Visit (INDEPENDENT_AMBULATORY_CARE_PROVIDER_SITE_OTHER): Payer: 59

## 2019-06-04 VITALS — BP 132/78 | HR 70 | Temp 98.7°F | Ht 63.0 in | Wt 153.0 lb

## 2019-06-04 DIAGNOSIS — Z309 Encounter for contraceptive management, unspecified: Secondary | ICD-10-CM

## 2019-06-04 MED ORDER — MEDROXYPROGESTERONE ACETATE 150 MG/ML IM SUSP
150.0000 mg | Freq: Once | INTRAMUSCULAR | Status: AC
  Administered 2019-06-04: 150 mg via INTRAMUSCULAR

## 2019-06-04 NOTE — Progress Notes (Signed)
Patient is here for Depo Provera Injection Patient is within Depo Provera Calender Limits, Yes Next Depo Due between: 08/20/19 through 09/03/19 Last AEX:03-13-19 AEX Scheduled: none  Patient is aware when next depo is due  Pt tolerated Injection well.  Routed to provider for review, encounter closed.

## 2019-08-15 NOTE — Progress Notes (Signed)
Patient is here for Depo Provera Injection Patient is within Depo Provera Calender Limits yes, last given 06-04-2019 Next Depo Due between: 5/31 to 6/14 Last AEX: 03-13-2019 BS AEX Scheduled: 04/01/2020  Patient is aware when next depo is due  Pt tolerated Injection well in LUOQ.   Routed to provider for review, encounter closed.

## 2019-08-20 ENCOUNTER — Other Ambulatory Visit: Payer: Self-pay

## 2019-08-20 ENCOUNTER — Ambulatory Visit (INDEPENDENT_AMBULATORY_CARE_PROVIDER_SITE_OTHER): Payer: 59

## 2019-08-20 VITALS — BP 118/72 | HR 68 | Temp 98.2°F | Ht 63.0 in | Wt 158.2 lb

## 2019-08-20 DIAGNOSIS — Z309 Encounter for contraceptive management, unspecified: Secondary | ICD-10-CM

## 2019-08-20 MED ORDER — MEDROXYPROGESTERONE ACETATE 150 MG/ML IM SUSP
150.0000 mg | Freq: Once | INTRAMUSCULAR | Status: AC
  Administered 2019-08-20: 10:00:00 150 mg via INTRAMUSCULAR

## 2019-11-06 ENCOUNTER — Ambulatory Visit: Payer: 59

## 2019-11-06 NOTE — Progress Notes (Deleted)
Patient is here for Depo Provera Injection Patient is within Depo Provera Calender Limits Yes Next Depo Due between: 8/17-8/31 Last AEX: 03/13/19 Dr. Jose Persia Scheduled: 04/01/20  Patient is aware when next depo is due  Pt tolerated Injection well in RUOQ.  Routed to provider for review, encounter closed.

## 2019-11-22 ENCOUNTER — Ambulatory Visit: Payer: Self-pay | Admitting: Obstetrics and Gynecology

## 2020-03-31 NOTE — Progress Notes (Deleted)
39 y.o. G0P0000 Single African American female here for annual exam.    PCP:     No LMP recorded. Patient has had an injection.           Sexually active: {yes no:314532}  The current method of family planning is *** Depo-Provera injections.    Exercising: {yes no:314532}  {types:19826} Smoker:  {YES NO:22349}  Health Maintenance: Pap:  03-13-19 Neg:Neg HR HPV, 10-24-17 Neg:Neg HR HPV,10-21-16 Neg:Pos HR HPV--Neg 16/18/45 History of abnormal Pap:  Yes, 10-21-16 Neg:Pos HR HPV--Neg 16/18/45 MMG:  n/a Colonoscopy:  n/a BMD: n/a   Result  n/a TDaP: 10-19-11 Gardasil:   no HIV: 03-13-19 NR Hep C: 03-13-19 Neg Screening Labs:  Hb today: ***, Urine today: ***   reports that she has been smoking cigarettes. She has a 5.00 pack-year smoking history. She has never used smokeless tobacco. She reports previous alcohol use. She reports that she does not use drugs.  Past Medical History:  Diagnosis Date  . Thyromegaly   . Trichomonas vaginalis infection 10/2017, 03/2019    Past Surgical History:  Procedure Laterality Date  . TONSILLECTOMY AND ADENOIDECTOMY  08/2018    Current Outpatient Medications  Medication Sig Dispense Refill  . medroxyPROGESTERone (DEPO-PROVERA) 150 MG/ML injection Inject 150 mg into the muscle every 3 (three) months.     No current facility-administered medications for this visit.    Family History  Problem Relation Age of Onset  . Asthma Mother   . Diabetes Mother   . High Cholesterol Mother   . Asthma Father   . Hypertension Father   . High Cholesterol Father   . Hyperlipidemia Brother   . Asthma Brother   . Diabetes Maternal Grandmother   . Diabetes Paternal Grandmother   . Hyperlipidemia Paternal Grandmother     Review of Systems  Exam:   There were no vitals taken for this visit.    General appearance: alert, cooperative and appears stated age Head: normocephalic, without obvious abnormality, atraumatic Neck: no adenopathy, supple, symmetrical,  trachea midline and thyroid normal to inspection and palpation Lungs: clear to auscultation bilaterally Breasts: normal appearance, no masses or tenderness, No nipple retraction or dimpling, No nipple discharge or bleeding, No axillary adenopathy Heart: regular rate and rhythm Abdomen: soft, non-tender; no masses, no organomegaly Extremities: extremities normal, atraumatic, no cyanosis or edema Skin: skin color, texture, turgor normal. No rashes or lesions Lymph nodes: cervical, supraclavicular, and axillary nodes normal. Neurologic: grossly normal  Pelvic: External genitalia:  no lesions              No abnormal inguinal nodes palpated.              Urethra:  normal appearing urethra with no masses, tenderness or lesions              Bartholins and Skenes: normal                 Vagina: normal appearing vagina with normal color and discharge, no lesions              Cervix: no lesions              Pap taken: {yes no:314532} Bimanual Exam:  Uterus:  normal size, contour, position, consistency, mobility, non-tender              Adnexa: no mass, fullness, tenderness              Rectal exam: {yes no:314532}.  Confirms.  Anus:  normal sphincter tone, no lesions  Chaperone was present for exam.  Assessment:   Well woman visit with normal exam.   Plan: Mammogram screening discussed. Self breast awareness reviewed. Pap and HR HPV as above. Guidelines for Calcium, Vitamin D, regular exercise program including cardiovascular and weight bearing exercise.   Follow up annually and prn.   Additional counseling given.  {yes [id number]. _______ minutes face to face time of which over 50% was spent in counseling.    After visit summary provided.

## 2020-04-01 ENCOUNTER — Encounter: Payer: Self-pay | Admitting: Obstetrics and Gynecology

## 2020-04-01 ENCOUNTER — Ambulatory Visit: Payer: Self-pay | Admitting: Obstetrics and Gynecology

## 2022-11-20 ENCOUNTER — Emergency Department (HOSPITAL_BASED_OUTPATIENT_CLINIC_OR_DEPARTMENT_OTHER)
Admission: EM | Admit: 2022-11-20 | Discharge: 2022-11-20 | Disposition: A | Discharge status: Home / Self Care | Payer: Medicaid Other | Attending: Emergency Medicine | Admitting: Emergency Medicine

## 2022-11-20 ENCOUNTER — Encounter (HOSPITAL_BASED_OUTPATIENT_CLINIC_OR_DEPARTMENT_OTHER): Payer: Self-pay | Admitting: Emergency Medicine

## 2022-11-20 ENCOUNTER — Other Ambulatory Visit: Payer: Self-pay

## 2022-11-20 DIAGNOSIS — K0889 Other specified disorders of teeth and supporting structures: Secondary | ICD-10-CM | POA: Diagnosis not present

## 2022-11-20 MED ORDER — HYDROCODONE-ACETAMINOPHEN 5-325 MG PO TABS
1.0000 | ORAL_TABLET | Freq: Once | ORAL | Status: AC
  Administered 2022-11-20: 1 via ORAL
  Filled 2022-11-20: qty 1

## 2022-11-20 MED ORDER — CLINDAMYCIN HCL 150 MG PO CAPS: 450.0000 mg | ORAL_CAPSULE | Freq: Three times a day (TID) | ORAL | 0 refills | Status: AC

## 2022-11-20 MED ORDER — HYDROCODONE-ACETAMINOPHEN 5-325 MG PO TABS: 1.0000 | ORAL_TABLET | Freq: Four times a day (QID) | ORAL | 0 refills | Status: AC | PRN

## 2022-11-20 NOTE — ED Triage Notes (Signed)
Right lower dental pain x 2 days.  No known fever.  Pain radiates to jaw and face

## 2022-11-20 NOTE — ED Provider Notes (Signed)
[place] [facility] Provider Note   CSN: [id number] Arrival date & time: 11/20/22  0700     History  Chief Complaint  Patient presents with   Dental Pain    Lacey Wu is a 42 y.o. female.   Dental Pain    42 year old female who presents to the emergency department with dental pain in the right lower teeth for the past 2 days.  She denies any fevers or chills.  She endorses radiation of the pain to the jaw and face.  She states the pain is about 7 out of 10 in severity.  She does not currently have a dentist.  Home Medications Prior to Admission medications   Medication Sig Start Date End Date Taking? Authorizing Provider  clindamycin (CLEOCIN) 150 MG capsule Take 3 capsules (450 mg total) by mouth 3 (three) times daily for 7 days. 11/20/22 11/27/22 Yes Ernie Avena, MD  HYDROcodone-acetaminophen (NORCO/VICODIN) 5-325 MG tablet Take 1-2 tablets by mouth every 6 (six) hours as needed. 11/20/22  Yes Ernie Avena, MD  medroxyPROGESTERone (DEPO-PROVERA) 150 MG/ML injection Inject 150 mg into the muscle every 3 (three) months.    [provider]      Allergies    Patient has no known allergies.    Review of Systems   Review of Systems  All other systems reviewed and are negative.   Physical Exam Updated Vital Signs BP (!) 150/82 (BP Location: Left Arm)   Pulse (!) 54   Temp 98.4 F (36.9 C) (Oral)   Resp 16   Ht 5\' 2"  (1.575 m)   Wt 86.2 kg   SpO2 100%   BMI 34.75 kg/m  Physical Exam Vitals and nursing note reviewed.  Constitutional:      General: She is not in acute distress. HENT:     Head: Normocephalic and atraumatic.     Mouth/Throat:     Comments: No evidence of abscess, pain about the lower incisors on the right.  Poor dentition present Eyes:     Conjunctiva/sclera: Conjunctivae normal.     Pupils: Pupils are equal, round, and reactive to light.  Cardiovascular:     Rate and Rhythm: Normal rate and  regular rhythm.  Pulmonary:     Effort: Pulmonary effort is normal. No respiratory distress.  Abdominal:     General: There is no distension.     Tenderness: There is no guarding.  Musculoskeletal:        General: No deformity or signs of injury.     Cervical back: Neck supple.  Skin:    Findings: No lesion or rash.  Neurological:     General: No focal deficit present.     Mental Status: She is alert. Mental status is at baseline.     ED Results / Procedures / Treatments   Labs (all labs ordered are listed, but only abnormal results are displayed) Labs Reviewed - No data to display  EKG None  Radiology No results found.  Procedures Procedures    Medications Ordered in ED Medications  HYDROcodone-acetaminophen (NORCO/VICODIN) 5-325 MG per tablet 1 tablet (1 tablet Oral Given 11/20/22 0740)    ED Course/ Medical Decision Making/ A&P                             Medical Decision Making Risk Prescription drug management.    42 year old female who presents to the emergency department with dental pain  in the right lower teeth for the past 2 days.  She denies any fevers or chills.  She endorses radiation of the pain to the jaw and face.  She states the pain is about 7 out of 10 in severity.  She does not currently have a dentist.  On arrival, the patient was vitally stable.  Patient on exam without evidence of abscess.  Currently she is awake, alert, and hemodynamically stable. Her exam is most notable for tenderness about the gingiva on the lower right incisors. Specifically, there is no: -Facial instability or report of recent trauma -Sublingual hardening or swelling -Oropharyngeal swelling -Trismus -TMJ tenderness -Mastoid tenderness -Tongue swelling -Submandibular or submental swelling or erythema  Differential Diagnoses:  This is most likely due to poor dentition and exposed nerve roots about the gingiva from tooth decay and gingival disease vs developing  odontogenic infection. No evidence of tooth abscess on exam. I have a low suspicion for an acute dental fracture, alveolar ridge fracture, mastoiditis, osteitis, ulcerative necrotizing gingivitis, PTA, RPA, LeMeirre syndrome, Ludwigs Angina, sepsis, or other acute emergent process  I offered the patient pain medication and antibiotics.  I believe that she is safe for discharge home. I provided ED return precautions. I prescribed her Norco and Clindamycin and provided local dentistry followup information. She feels safe and comfortable with this plan.  While in the ED, the patient received: Medications  HYDROcodone-acetaminophen (NORCO/VICODIN) 5-325 MG per tablet 1 tablet (1 tablet Oral Given 11/20/22 0740)    Final Clinical Impression(s) / ED Diagnoses Final diagnoses:  Pain, dental    Rx / DC Orders ED Discharge Orders          Ordered    clindamycin (CLEOCIN) 150 MG capsule  3 times daily        11/20/22 0802    HYDROcodone-acetaminophen (NORCO/VICODIN) 5-325 MG tablet  Every 6 hours PRN        11/20/22 0802              Ernie Avena, MD 11/20/22 [phone]

## 2022-11-20 NOTE — Discharge Instructions (Addendum)
Your exam showed no evidence of abscess.  Will treat you with antibiotics and pain medicine.  Please follow-up with a dentist.
